# Patient Record
Sex: Male | Born: 1950
Health system: Southern US, Community
[De-identification: ages and names within clinical notes are randomized; demographics above are authoritative.]

## PROBLEM LIST (undated history)

## (undated) DIAGNOSIS — E039 Hypothyroidism, unspecified: Secondary | ICD-10-CM

## (undated) DIAGNOSIS — J939 Pneumothorax, unspecified: Secondary | ICD-10-CM

## (undated) DIAGNOSIS — E109 Type 1 diabetes mellitus without complications: Secondary | ICD-10-CM

## (undated) HISTORY — PX: CHEST TUBE INSERTION: SHX231

---

## 1977-04-06 DIAGNOSIS — E119 Type 2 diabetes mellitus without complications: Secondary | ICD-10-CM | POA: Insufficient documentation

## 2006-12-24 ENCOUNTER — Inpatient Hospital Stay (HOSPITAL_COMMUNITY): Admission: AD | Admit: 2006-12-24 | Discharge: 2006-12-25 | Payer: Self-pay | Admitting: Psychiatry

## 2006-12-24 ENCOUNTER — Ambulatory Visit: Payer: Self-pay | Admitting: Psychiatry

## 2009-02-08 ENCOUNTER — Ambulatory Visit: Payer: Self-pay | Admitting: Nurse Practitioner

## 2009-02-08 DIAGNOSIS — E039 Hypothyroidism, unspecified: Secondary | ICD-10-CM | POA: Insufficient documentation

## 2009-02-08 LAB — CONVERTED CEMR LAB
Bilirubin Urine: NEGATIVE
Blood Glucose, Fingerstick: 220
Blood in Urine, dipstick: NEGATIVE
Glucose, Urine, Semiquant: 250
Hgb A1c MFr Bld: 8.2 %
Nitrite: NEGATIVE
Protein, U semiquant: NEGATIVE
Rapid HIV Screen: NEGATIVE
Specific Gravity, Urine: 1.025
Urobilinogen, UA: 0.2
WBC Urine, dipstick: NEGATIVE
pH: 5.5

## 2009-02-11 ENCOUNTER — Encounter (INDEPENDENT_AMBULATORY_CARE_PROVIDER_SITE_OTHER): Payer: Self-pay | Admitting: Nurse Practitioner

## 2009-02-11 LAB — CONVERTED CEMR LAB
ALT: 21 units/L (ref 0–53)
AST: 20 units/L (ref 0–37)
Albumin: 4.4 g/dL (ref 3.5–5.2)
Alkaline Phosphatase: 95 units/L (ref 39–117)
BUN: 20 mg/dL (ref 6–23)
Basophils Absolute: 0.1 10*3/uL (ref 0.0–0.1)
Basophils Relative: 2 % — ABNORMAL HIGH (ref 0–1)
CO2: 29 meq/L (ref 19–32)
Calcium: 9.2 mg/dL (ref 8.4–10.5)
Chloride: 100 meq/L (ref 96–112)
Creatinine, Ser: 0.89 mg/dL (ref 0.40–1.50)
Eosinophils Absolute: 0.6 10*3/uL (ref 0.0–0.7)
Eosinophils Relative: 9 % — ABNORMAL HIGH (ref 0–5)
Free T4: 0.87 ng/dL (ref 0.80–1.80)
Glucose, Bld: 226 mg/dL — ABNORMAL HIGH (ref 70–99)
HCT: 45.7 % (ref 39.0–52.0)
Hemoglobin: 15.5 g/dL (ref 13.0–17.0)
Lymphocytes Relative: 41 % (ref 12–46)
Lymphs Abs: 2.5 10*3/uL (ref 0.7–4.0)
MCHC: 33.9 g/dL (ref 30.0–36.0)
MCV: 93.3 fL (ref 78.0–100.0)
Microalb, Ur: 0.59 mg/dL (ref 0.00–1.89)
Monocytes Absolute: 0.7 10*3/uL (ref 0.1–1.0)
Monocytes Relative: 11 % (ref 3–12)
Neutro Abs: 2.3 10*3/uL (ref 1.7–7.7)
Neutrophils Relative %: 37 % — ABNORMAL LOW (ref 43–77)
PSA: 0.84 ng/mL (ref 0.10–4.00)
Platelets: 229 10*3/uL (ref 150–400)
Potassium: 4.6 meq/L (ref 3.5–5.3)
RBC: 4.9 M/uL (ref 4.22–5.81)
RDW: 12.7 % (ref 11.5–15.5)
Sodium: 140 meq/L (ref 135–145)
T3, Free: 2.5 pg/mL (ref 2.3–4.2)
TSH: 9.544 microintl units/mL — ABNORMAL HIGH (ref 0.350–4.500)
Total Bilirubin: 1 mg/dL (ref 0.3–1.2)
Total Protein: 6.7 g/dL (ref 6.0–8.3)
WBC: 6.2 10*3/uL (ref 4.0–10.5)

## 2009-03-05 ENCOUNTER — Ambulatory Visit: Payer: Self-pay | Admitting: Nurse Practitioner

## 2009-03-05 LAB — CONVERTED CEMR LAB
Bilirubin Urine: NEGATIVE
Blood Glucose, Fingerstick: 38
Blood in Urine, dipstick: NEGATIVE
Glucose, Urine, Semiquant: NEGATIVE
Ketones, urine, test strip: NEGATIVE
Nitrite: NEGATIVE
Protein, U semiquant: NEGATIVE
Specific Gravity, Urine: <1.005
Urobilinogen, UA: 0.2
WBC Urine, dipstick: NEGATIVE
pH: 7

## 2009-03-06 ENCOUNTER — Encounter (INDEPENDENT_AMBULATORY_CARE_PROVIDER_SITE_OTHER): Payer: Self-pay | Admitting: Nurse Practitioner

## 2009-03-06 DIAGNOSIS — E7800 Pure hypercholesterolemia, unspecified: Secondary | ICD-10-CM | POA: Insufficient documentation

## 2009-03-06 LAB — CONVERTED CEMR LAB
Chlamydia, Swab/Urine, PCR: NEGATIVE
Cholesterol: 216 mg/dL — ABNORMAL HIGH (ref 0–200)
GC Probe Amp, Urine: NEGATIVE
HDL: 69 mg/dL (ref >39)
LDL Cholesterol: 120 mg/dL — ABNORMAL HIGH (ref 0–99)
Total CHOL/HDL Ratio: 3.1
Triglycerides: 134 mg/dL (ref <150)
VLDL: 27 mg/dL (ref 0–40)

## 2009-04-12 ENCOUNTER — Ambulatory Visit: Payer: Self-pay | Admitting: Nurse Practitioner

## 2009-04-15 LAB — CONVERTED CEMR LAB: TSH: 7.01 microintl units/mL — ABNORMAL HIGH (ref 0.350–4.500)

## 2009-04-22 ENCOUNTER — Telehealth (INDEPENDENT_AMBULATORY_CARE_PROVIDER_SITE_OTHER): Payer: Self-pay | Admitting: Nurse Practitioner

## 2009-04-25 ENCOUNTER — Ambulatory Visit: Payer: Self-pay | Admitting: Nurse Practitioner

## 2009-04-25 LAB — CONVERTED CEMR LAB
Blood Glucose, Fingerstick: 196
Cholesterol, target level: 200 mg/dL
HDL goal, serum: 40 mg/dL
LDL Goal: 100 mg/dL

## 2009-06-03 ENCOUNTER — Ambulatory Visit: Payer: Self-pay | Admitting: Nurse Practitioner

## 2009-06-03 LAB — CONVERTED CEMR LAB
Blood Glucose, Fingerstick: 111
Hgb A1c MFr Bld: 8.1 %

## 2009-06-17 ENCOUNTER — Ambulatory Visit: Payer: Self-pay | Admitting: Nurse Practitioner

## 2009-06-17 LAB — CONVERTED CEMR LAB
Cholesterol: 193 mg/dL (ref 0–200)
HDL: 66 mg/dL (ref >39)
LDL Cholesterol: 104 mg/dL — ABNORMAL HIGH (ref 0–99)
TSH: 3.61 microintl units/mL (ref 0.350–4.500)
Total CHOL/HDL Ratio: 2.9
Triglycerides: 117 mg/dL (ref <150)
VLDL: 23 mg/dL (ref 0–40)

## 2009-06-18 ENCOUNTER — Encounter (INDEPENDENT_AMBULATORY_CARE_PROVIDER_SITE_OTHER): Payer: Self-pay | Admitting: Nurse Practitioner

## 2009-07-23 ENCOUNTER — Encounter: Admission: RE | Admit: 2009-07-23 | Discharge: 2009-07-23 | Payer: Self-pay | Admitting: Nurse Practitioner

## 2009-09-12 ENCOUNTER — Ambulatory Visit: Payer: Self-pay | Admitting: Nurse Practitioner

## 2009-09-12 DIAGNOSIS — Z872 Personal history of diseases of the skin and subcutaneous tissue: Secondary | ICD-10-CM | POA: Insufficient documentation

## 2009-09-12 LAB — CONVERTED CEMR LAB
Blood Glucose, Fingerstick: 123
Hgb A1c MFr Bld: 8.1 %

## 2009-09-13 ENCOUNTER — Telehealth (INDEPENDENT_AMBULATORY_CARE_PROVIDER_SITE_OTHER): Payer: Self-pay | Admitting: Nurse Practitioner

## 2009-09-26 ENCOUNTER — Ambulatory Visit: Payer: Self-pay | Admitting: Nurse Practitioner

## 2009-09-26 LAB — CONVERTED CEMR LAB: Blood Glucose, Fingerstick: 394

## 2009-10-11 ENCOUNTER — Ambulatory Visit: Payer: Self-pay | Admitting: Nurse Practitioner

## 2009-10-11 DIAGNOSIS — I10 Essential (primary) hypertension: Secondary | ICD-10-CM | POA: Insufficient documentation

## 2009-10-11 LAB — CONVERTED CEMR LAB: Blood Glucose, Fingerstick: 177

## 2009-10-17 ENCOUNTER — Encounter (INDEPENDENT_AMBULATORY_CARE_PROVIDER_SITE_OTHER): Payer: Self-pay | Admitting: Nurse Practitioner

## 2009-10-22 ENCOUNTER — Encounter (INDEPENDENT_AMBULATORY_CARE_PROVIDER_SITE_OTHER): Payer: Self-pay | Admitting: Nurse Practitioner

## 2009-11-12 ENCOUNTER — Ambulatory Visit: Payer: Self-pay | Admitting: Nurse Practitioner

## 2009-11-12 LAB — CONVERTED CEMR LAB: Blood Glucose, Fingerstick: 36

## 2009-12-03 ENCOUNTER — Telehealth (INDEPENDENT_AMBULATORY_CARE_PROVIDER_SITE_OTHER): Payer: Self-pay | Admitting: Nurse Practitioner

## 2009-12-04 ENCOUNTER — Telehealth (INDEPENDENT_AMBULATORY_CARE_PROVIDER_SITE_OTHER): Payer: Self-pay | Admitting: Nurse Practitioner

## 2009-12-17 ENCOUNTER — Telehealth (INDEPENDENT_AMBULATORY_CARE_PROVIDER_SITE_OTHER): Payer: Self-pay | Admitting: Nurse Practitioner

## 2009-12-18 ENCOUNTER — Telehealth (INDEPENDENT_AMBULATORY_CARE_PROVIDER_SITE_OTHER): Payer: Self-pay | Admitting: Nurse Practitioner

## 2009-12-23 ENCOUNTER — Telehealth (INDEPENDENT_AMBULATORY_CARE_PROVIDER_SITE_OTHER): Payer: Self-pay | Admitting: Nurse Practitioner

## 2009-12-25 ENCOUNTER — Telehealth (INDEPENDENT_AMBULATORY_CARE_PROVIDER_SITE_OTHER): Payer: Self-pay | Admitting: Nurse Practitioner

## 2010-01-03 ENCOUNTER — Encounter (INDEPENDENT_AMBULATORY_CARE_PROVIDER_SITE_OTHER): Payer: Self-pay | Admitting: Nurse Practitioner

## 2010-01-13 ENCOUNTER — Ambulatory Visit: Payer: Self-pay | Admitting: Nurse Practitioner

## 2010-01-13 LAB — CONVERTED CEMR LAB: Blood Glucose, Fingerstick: 118

## 2010-02-14 ENCOUNTER — Telehealth (INDEPENDENT_AMBULATORY_CARE_PROVIDER_SITE_OTHER): Payer: Self-pay | Admitting: Nurse Practitioner

## 2010-02-14 ENCOUNTER — Ambulatory Visit: Payer: Self-pay | Admitting: Nurse Practitioner

## 2010-02-14 DIAGNOSIS — K029 Dental caries, unspecified: Secondary | ICD-10-CM | POA: Insufficient documentation

## 2010-02-14 LAB — CONVERTED CEMR LAB
Blood Glucose, Fingerstick: 76
Cholesterol: 182 mg/dL (ref 0–200)
Free T4: 0.96 ng/dL (ref 0.80–1.80)
HDL: 57 mg/dL (ref >39)
Hgb A1c MFr Bld: 6.7 %
LDL Cholesterol: 115 mg/dL — ABNORMAL HIGH (ref 0–99)
T3, Free: 3 pg/mL (ref 2.3–4.2)
TSH: 5.108 microintl units/mL — ABNORMAL HIGH (ref 0.350–4.500)
Total CHOL/HDL Ratio: 3.2
Triglycerides: 48 mg/dL (ref <150)
VLDL: 10 mg/dL (ref 0–40)

## 2010-02-17 ENCOUNTER — Encounter (INDEPENDENT_AMBULATORY_CARE_PROVIDER_SITE_OTHER): Payer: Self-pay | Admitting: Nurse Practitioner

## 2010-02-17 ENCOUNTER — Telehealth (INDEPENDENT_AMBULATORY_CARE_PROVIDER_SITE_OTHER): Payer: Self-pay | Admitting: Nurse Practitioner

## 2010-02-24 ENCOUNTER — Encounter (INDEPENDENT_AMBULATORY_CARE_PROVIDER_SITE_OTHER): Payer: Self-pay | Admitting: *Deleted

## 2010-03-03 ENCOUNTER — Ambulatory Visit: Payer: Self-pay | Admitting: Nurse Practitioner

## 2010-05-08 ENCOUNTER — Ambulatory Visit: Admit: 2010-05-08 | Payer: Self-pay | Admitting: Nurse Practitioner

## 2010-05-08 ENCOUNTER — Encounter: Payer: Self-pay | Admitting: Nurse Practitioner

## 2010-05-08 ENCOUNTER — Encounter (INDEPENDENT_AMBULATORY_CARE_PROVIDER_SITE_OTHER): Payer: Self-pay | Admitting: Nurse Practitioner

## 2010-05-08 LAB — CONVERTED CEMR LAB
ALT: 20 units/L (ref 0–53)
AST: 23 units/L (ref 0–37)
Albumin: 4.3 g/dL (ref 3.5–5.2)
Alkaline Phosphatase: 81 units/L (ref 39–117)
BUN: 18 mg/dL (ref 6–23)
Basophils Absolute: 0.1 10*3/uL (ref 0.0–0.1)
Basophils Relative: 1 % (ref 0–1)
Bilirubin Urine: NEGATIVE
Blood Glucose, Fingerstick: 110
Blood in Urine, dipstick: NEGATIVE
CO2: 27 meq/L (ref 19–32)
Calcium: 9.1 mg/dL (ref 8.4–10.5)
Chloride: 104 meq/L (ref 96–112)
Creatinine, Ser: 0.88 mg/dL (ref 0.40–1.50)
Creatinine, Urine: 132.2 mg/dL
Eosinophils Absolute: 0.2 10*3/uL (ref 0.0–0.7)
Eosinophils Relative: 3 % (ref 0–5)
Glucose, Bld: 155 mg/dL — ABNORMAL HIGH (ref 70–99)
Glucose, Urine, Semiquant: NEGATIVE
HCT: 42.8 % (ref 39.0–52.0)
Hemoglobin: 14.4 g/dL (ref 13.0–17.0)
Hgb A1c MFr Bld: 7.2 %
Ketones, urine, test strip: NEGATIVE
Lymphocytes Relative: 32 % (ref 12–46)
Lymphs Abs: 2 10*3/uL (ref 0.7–4.0)
MCHC: 33.6 g/dL (ref 30.0–36.0)
MCV: 92.6 fL (ref 78.0–100.0)
Microalb Creat Ratio: 3.9 mg/g (ref 0.0–30.0)
Microalb, Ur: 0.52 mg/dL (ref 0.00–1.89)
Monocytes Absolute: 0.5 10*3/uL (ref 0.1–1.0)
Monocytes Relative: 7 % (ref 3–12)
Neutro Abs: 3.6 10*3/uL (ref 1.7–7.7)
Neutrophils Relative %: 58 % (ref 43–77)
Nitrite: NEGATIVE
PSA: 0.68 ng/mL (ref <=4.00)
Platelets: 224 10*3/uL (ref 150–400)
Potassium: 3.9 meq/L (ref 3.5–5.3)
Protein, U semiquant: NEGATIVE
RBC: 4.62 M/uL (ref 4.22–5.81)
RDW: 12.6 % (ref 11.5–15.5)
Sodium: 143 meq/L (ref 135–145)
Specific Gravity, Urine: 1.015
TSH: 2.201 microintl units/mL (ref 0.350–4.500)
Total Bilirubin: 1.3 mg/dL — ABNORMAL HIGH (ref 0.3–1.2)
Total Protein: 6.7 g/dL (ref 6.0–8.3)
Urobilinogen, UA: 0.2
WBC Urine, dipstick: NEGATIVE
WBC: 6.3 10*3/uL (ref 4.0–10.5)
pH: 5.5

## 2010-05-08 NOTE — Progress Notes (Signed)
Summary: [facility] Ref[facility]  Phone Note Outgoing Call   Summary of Call: refer to [facility]  worrisome skin lesion on left flank Initial call taken by: Lehman Prom FNP,  September 13, 2009 10:00 AM  Follow-up for Phone Call        Is Pt aware that this will be out of pocket? Follow-up by: Candi Leash,  September 16, 2009 8:40 AM  Additional Follow-up for Phone Call Additional follow up Details #1::        No, this office previously saw our pts at cost of co-payment.  Has this changed recently? If so, refer to dermatology at [address]. ? 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 Will call [facility] to follow up  thanks.  will await the update n.[email or web address],fnp  September 16, 2009  9:40 AM   Follow-up by: Candi Leash,  September 16, 2009 9:31 AM  Additional Follow-up for Phone Call Additional follow up Details #3:: Details for Additional Follow-up Action Taken: Well spoke to [facility] &  Pt is responsible for the bill.Pt would need to come in with 85.00 then work out an payment plan.I will call the Pt to see if this is affordable for him.   OK.  n.[email or web address],fnp  September 18, 2009  4:18 PM   Additional Follow-up by: Candi Leash,  September 18, 2009 3:47 PM

## 2010-05-08 NOTE — Letter (Signed)
Summary: MAP ASSISTANCE PROGRAM//FAXED  MAP ASSISTANCE PROGRAM//FAXED   Imported By: Arta Bruce 01/03/2010 10:42:47  _____________________________________________________________________  External Attachment:    Type:   Image     Comment:   External Document

## 2010-05-08 NOTE — Progress Notes (Signed)
Summary: Lab results  Phone Note Outgoing Call   Summary of Call: notify pt's sister (he signed for her to get this information during last visit)  Tanya Nones [phone] Cholesterol labs ok during recent office visit. Thyroid lab is just slightly higher than it should be but will monitor for now.  An increase in medication at this time may convert pt to HYPERthyroid (too much) status. thyroid lab will be rechecked in 3 months. pt will be mailed a letter  Initial call taken by: Lehman Prom FNP,  February 17, 2010 8:56 AM  Follow-up for Phone Call        left message on machine for pt's sister to return call to the office. Levon Hedger  February 18, 2010 2:36 PM    pt's sister informed of above information.  Follow-up by: Levon Hedger,  February 18, 2010 3:06 PM

## 2010-05-08 NOTE — Letter (Signed)
Summary: DERMATOLOGY & SKIN  CARE REPORT  DERMATOLOGY & SKIN  CARE REPORT   Imported By: Arta Bruce 10/23/2009 12:43:15  _____________________________________________________________________  External Attachment:    Type:   Image     Comment:   External Document

## 2010-05-08 NOTE — Letter (Signed)
Summary: Handout Printed  Printed Handout:  - Diet - Low-Fat, Low-Saturated-Fat, Low-Cholesterol Diets 

## 2010-05-08 NOTE — Progress Notes (Signed)
Summary: med refill  Phone Note Refill Request Message from:  Patient  Refills Requested: Medication #1:  LANTUS 100 UNIT/ML SOLN 12 units subcutaneously nightly. [facility] [address]  Initial call taken by: Oscar La,  December 17, 2009 12:30 PM  Follow-up for Phone Call        forward to N. Daphine Deutscher, fnp Follow-up by: Levon Hedger,  December 17, 2009 2:59 PM  Additional Follow-up for Phone Call Additional follow up Details #1::        will send lantus to [facility] per pt request but he should be advise it will be expensive.  why is he not using [facility]?  they could order lantus from a medication assistance plan so it would not cost him so much money Additional Follow-up by: Lehman Prom FNP,  December 17, 2009 6:38 PM    Additional Follow-up for Phone Call Additional follow up Details #2::    Levon Hedger  December 18, 2009 9:20 AM Left message on machine for pt to return call to the office.  Additional Follow-up for Phone Call Additional follow up Details #3:: Details for Additional Follow-up Action Taken: pt was wanting to get meds at one pharmacy. He is currently getting NPH insulin at [facility] so he thought getting about Lantus as well. pt advised Lantus is expensive and is willing to get meds at [facility]. Please transfer all meds/ RX's to HD... Chantel Miller  December 18, 2009 11:58 AM   Rx printed and faxed to [facility] n.[email or web address],fnp December 18, 2009  12:36 PM     Prescriptions: NOVOLIN 70/30 70-30 % SUSP (INSULIN ISOPHANE & REGULAR) 28 units subcutaneously in the morning and 28 units subcutaneously at night for diabetes  #1 month qs x 3   Entered and Authorized by:   Lehman Prom FNP   Signed by:   Lehman Prom FNP on 12/18/2009   Method used:   Printed then faxed to ...       [facility] (retail)       [address] [place], [place]  [address]       Ph: [phone]      Fax: [phone]   RxID:   [id number] LANTUS 100 UNIT/ML SOLN (INSULIN GLARGINE) 12 units subcutaneously nightly  #1 month qs x 3   Entered and Authorized by:   Lehman Prom FNP   Signed by:   Lehman Prom FNP on 12/18/2009   Method used:   Printed then faxed to ...       [facility] (retail)       [address] [place], [place]  [address]       Ph: [phone]       Fax: [phone]   RxID:   [id number] LANTUS 100 UNIT/ML SOLN (INSULIN GLARGINE) 12 units subcutaneously nightly  #1 month qs x 3   Entered and Authorized by:   Lehman Prom FNP   Signed by:   Lehman Prom FNP on 12/17/2009   Method used:   Electronically to        [facility] [phone]* (retail)       [address]       [place], [place]  [address]       Ph: [phone]       Fax: [phone]   RxID:   [id number]

## 2010-05-08 NOTE — Progress Notes (Signed)
Summary: BLOOD SUGARS KEEP DROPPING REAL LOW  Phone Note Call from Patient Call back at Home Phone [phone] Call back at [phone]   Caller: [name]- SISTER Summary of Call: MARTI PT. SISTER CALLED AND SAYS THAT [name] SUGAR KEEPS DROPPING AND HE IS NOT AWARE OF IT, AND THEY ARE TRYING TO GET HIM A MONITOR THAT WILL ALARM HIM WHEN HIS SUGAR IS GETTING LOW. THE NAME OF THE DEVICE IS CALLED DEXCOM 7 PLUS CONTINIOUS  GLUCOSE MONITORING SYSTEM, AND THIS HAS TO BE ON THE RX, AND IF YOU HAVE ANY QUESTIONS, WE CAN CALL THE [place] SALES REP. JIM STROUP AT [phone].Marland Kitchen  SISTER IS CONCERNED ABOUT HIM, AND THINKS MAYBE HISTHYROIDS MIGHT NEED TO BE CHECKED, AND HE HAS FALLEN ASLEEP WHILE DRIVING Initial call taken by: Leodis Rains,  December 03, 2009 9:42 AM  Follow-up for Phone Call        Sent to N. Daphine Deutscher.  Dutch Quint RN  December 03, 2009 10:37 AM   Additional Follow-up for Phone Call Additional follow up Details #1::        Notify pt's sister the following - PT'S SUGAR KEEPS DROPPING BECAUSE HE CONTINUES TO TAKE INSULIN BASED ON WHAT HE THINKS HE SHOULD TAKE NOT WHAT THE [name] PRESCRIBES. AS LONG AS HE KEEPS DOING THIS THEN HE WILL HAVE HYPOGLYCEMIC EPISODES.  He also is not following a diabetic diet, he eats whatever he wants Thyroid is doing fine at last check. Additional Follow-up by: Lehman Prom FNP,  December 03, 2009 11:04 AM    Additional Follow-up for Phone Call Additional follow up Details #2::    Left message on answering machine for pt. to return call.  Dutch Quint RN  December 03, 2009 12:01 PM  Explained to sister what [name]'s response and recommendations.  Still thinks that he's not mentally able to monitor himself without the continuous monitor- would like a Rx for it, that her parents will pay for it if needed.  She thinks that the visual/audio aspect of the continuous monitor will help keep him on track.     States that his activity level has gone way up  and he isn't eating enough -- that he's taking too much insulin for his dietary needs.  Thinks that his activity level has changed his insulin needs and that talking to someone would help him understand.  Would like appt. with Susie Piper so that he can have his dietary and insulin regimen clarified.    Follow-up by: Dutch Quint RN,  December 03, 2009 4:10 PM  Additional Follow-up for Phone Call Additional follow up Details #3:: Details for Additional Follow-up Action Taken: order for meter written and in basket pt/sister can pick up  Pt. sister notified of Rx -- will pick up.  Dutch Quint RN  December 04, 2009 10:32 AM  Additional Follow-up by: Lehman Prom FNP,  December 04, 2009 8:26 AM

## 2010-05-08 NOTE — Letter (Signed)
Summary: Lipid Letter  [facility]  [address] [place], [place] [address]   Phone: [phone]  Fax: [phone]    06/18/2009  [name] [address] [place], [place]  [address]  Dear [name]:  We have carefully reviewed your last lipid profile from 06/17/2009 and the results are noted below with a summary of recommendations for lipid management.    Cholesterol:       193     Goal: less than 200   HDL "good" Cholesterol:   66     Goal: greater than 40   LDL "bad" Cholesterol:   104     Goal: less than 70   Triglycerides:       117     Goal: less than 150    Your cholesterol is doing much better.  Keep taking your cholesterol medications Your thyroid lab is ok. Continue taking your thyroid medications.     Current Medications: 1)    Novolin 70/30 70-30 % Susp (Insulin isophane & regular) .... 30 units subcutaneously in the morning and 30 units subcutaneously at night for diabetes 2)    Blood Glucose Meter  Kit (Blood glucose monitoring suppl) .... Dispense to check blood sugar twice daily 3)    Lancets  Misc (Lancets) .... Use to check blood sugar twice daily 4)    Blood Glucose Test  Strp (Glucose blood) .... Use to check blood sugar twice daily 5)    Levothyroxine Sodium 50 Mcg Tabs (Levothyroxine sodium) .... One tablet by mouth daily for thyroid **pharmacy - note change in dose** 6)    Pravastatin Sodium 10 Mg Tabs (Pravastatin sodium) .... One tablet by mouth nightly for cholesterol 7)    Bayer Childrens Aspirin 81 Mg Chew (Aspirin) .... One tablet by mouth daily  If you have any questions, please call. We appreciate being able to work with you.   Sincerely,    [facility] Lehman Prom FNP

## 2010-05-08 NOTE — Progress Notes (Signed)
Summary: Dental Referral  Phone Note Outgoing Call   Summary of Call: got a request that pt wants dental referral for hot and cold sensitiivty in teeth dental clinic will only see pt for repairs and extractions of teeth for sensitivity issues he can use sensidine toothpaste which can be purchased at any local pharmacy Initial call taken by: Lehman Prom FNP,  February 14, 2010 5:39 PM  Follow-up for Phone Call        C[name]  February 18, 2010 3:10 PM Left message on machine for pt to return call to the office.  Cory [name]  February 21, 2010 3:05 PM Left message on machine for pt to return call to the office.  Cory [name]  February 24, 2010 2:36 PM Left message on machine for pt to return call to the office.  Will mail letter. Follow-up by: [name],  February 24, 2010 2:36 PM

## 2010-05-08 NOTE — Assessment & Plan Note (Signed)
Summary: NEW - Establish Care   Vital Signs:  Patient profile:   60 year old male Height:      71.75 inches Weight:      179.3 pounds BMI:     24.58 BSA:     2.03 Temp:     97.8 degrees F oral Pulse rate:   66 / minute Pulse rhythm:   regular Resp:     16 per minute BP sitting:   119 / 73  (left arm) Cuff size:   large  Vitals Entered By: Levon Hedger (February 08, 2009 2:51 PM) CC: new establish DM Is Patient Diabetic? Yes Pain Assessment Patient in pain? no      CBG Result 220 CBG Device ID A  Does patient need assistance? Functional Status Self care Ambulation Normal Comments pt is using Humulin 70/30    CC:  new establish DM.  History of Present Illness:  Pt into the office to establish care. No previous PCP  HX - Diabetes since 1979 - insulin dependent He reports that he has been taking insulin daily.  He purchases it over the counter.   Pt does have a glucometer and sporatically checks his blood sugar.  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 Sister present with pt today in office.   Habits & Providers  Alcohol-Tobacco-Diet     Alcohol drinks/day: 0     Tobacco Status: never  Exercise-Depression-Behavior     Does Patient Exercise: no     Have you felt down or hopeless? no     Have you felt little pleasure in things? yes     Depression Counseling: going to a couselor     Drug Use: never  Comments: Admits to some recent stressors at home for which he is sorting out legally and is here today to fulfil his requirement to have a "check up"  Allergies (verified): No Known Drug Allergies  Past History:  Past Surgical History: pneumothorax - 1979  Family History: prostate cancer - father ulcerative colitis - daughter  Social History: married 5 children  tobacco - none ETOH -  Drug - Smoking Status:  never Drug Use:  never Does Patient Exercise:   no  Review of Systems General:  cold sensitivity. CV:  Denies chest pain or discomfort. Resp:  Denies cough. GI:  Denies abdominal pain. Neuro:  Denies numbness and tingling. Psych:  Complains of mental problems; short term memory problems. Endo:  Denies excessive thirst and excessive urination.  Physical Exam  General:  alert.   Head:  male-pattern balding.   Lungs:  normal breath sounds.   Heart:  normal rate and regular rhythm.   Abdomen:  normal bowel sounds.   Msk:  up to the exam table Neurologic:  alert & oriented X3.     Impression & Recommendations:  Problem # 1:  DIABETES MELLITUS, TYPE II, ON INSULIN (ICD-250.00) uncontrolled. will change insulin advised pt on his Hgba1c value and goal  His updated medication list for this problem includes:    Novolin 70/30 70-30 % Susp (Insulin isophane & regular) .Marland Kitchen... 32 units subcutaneously in the morning and 32 units subcutaneously at night for diabetes  Orders: Capillary Blood Glucose/CBG ([address]) Hgb A1C (60454UJ) T-Comprehensive Metabolic Panel (81191-47829) T-CBC w/Diff (56213-08657) T-PSA (84696-29528) T-TSH (41324-40102) T-Urine Microalbumin w/creat. ratio [id number]) T-HIV Antibody  (Reflex) ([id number])  Problem # 2:  HYPOTHYROIDISM (ICD-244.9) will check labs given pt's history  Complete Medication List: 1)  Novolin 70/30 70-30 % Susp (Insulin isophane & regular) .... 32 units subcutaneously in the morning and 32 units subcutaneously at night for diabetes 2)  Blood Glucose Meter Kit (Blood glucose monitoring suppl) .... Dispense to check blood sugar twice daily 3)  Lancets Misc (Lancets) .... Use to check blood sugar twice daily 4)  Blood Glucose Test Strp (Glucose blood) .... Use to check blood sugar twice daily  Patient Instructions: 1)  Schedule an appointment for a complete physical exam 2)  You will need to be fasting before this visit for lipids. 3)  Will need EKG, rectal/prostate, PHQ-9, Flu  Vaccine, foot check 4)  Your blood sugar is elevated.  You will need to increase your insulin, monitor your diet and exercise for better control. Prescriptions: NOVOLIN 70/30 70-30 % SUSP (INSULIN ISOPHANE & REGULAR) 32 units subcutaneously in the morning and 32 units subcutaneously at night for diabetes  #1 month qs x 3   Entered and Authorized by:   Lehman Prom FNP   Signed by:   Lehman Prom FNP on 02/11/2009   Method used:   Telephoned to ...         RxID:   [id number] BLOOD GLUCOSE TEST  STRP (GLUCOSE BLOOD) Use to check blood sugar twice daily  #100 x 3   Entered and Authorized by:   Lehman Prom FNP   Signed by:   Lehman Prom FNP on 02/08/2009   Method used:   Print then Give to Patient   RxID:   [id number] LANCETS  MISC (LANCETS) Use to check blood sugar twice daily  #100 x 3   Entered and Authorized by:   Lehman Prom FNP   Signed by:   Lehman Prom FNP on 02/08/2009   Method used:   Print then Give to Patient   RxID:   [id number] BLOOD GLUCOSE METER  KIT (BLOOD GLUCOSE MONITORING SUPPL) Dispense to check blood sugar twice daily  #1 meter x 0   Entered and Authorized by:   Lehman Prom FNP   Signed by:   Lehman Prom FNP on 02/08/2009   Method used:   Print then Give to Patient   RxID:   [id number] NOVOLOG MIX 70/30 70-30 % SUSP (INSULIN ASPART PROT & ASPART) 32 units in the morning and 32 units at night subcutaneously for diabetes  #1 month qs x 3   Entered and Authorized by:   Lehman Prom FNP   Signed by:   Lehman Prom FNP on 02/08/2009   Method used:   Print then Give to Patient   RxID:   [id number]   Laboratory Results   Urine Tests  Date/Time Received: February 08, 2009 4:07 PM  Date/Time Reported: February 08, 2009 4:07 PM n  Routine Urinalysis   Color: lt. yellow Appearance: Clear Glucose: 250   (Normal Range: Negative) Bilirubin: negative   (Normal Range: Negative) Ketone: trace (5)   (Normal  Range: Negative) Spec. Gravity: 1.025   (Normal Range: 1.003-1.035) Blood: negative   (Normal Range: Negative) pH: 5.5   (Normal Range: 5.0-8.0) Protein: negative   (Normal Range: Negative) Urobilinogen: 0.2   (Normal Range: 0-1) Nitrite: negative   (Normal Range: Negative) Leukocyte Esterace: negative   (Normal Range: Negative)     Blood Tests   Date/Time Received: February 08, 2009 3:35 PM   HGBA1C: 8.2%   (Normal Range: Non-Diabetic - 3-6%   Control Diabetic - 6-8%) CBG Random:: 220mg /dL  Date/Time Received:  February 08, 2009 5:23 PM  Date/Time Reported: February 08, 2009 5:23 PM   Other Tests  Rapid HIV: negative   Laboratory Results   Urine Tests    Routine Urinalysis   Color: lt. yellow Appearance: Clear Glucose: 250   (Normal Range: Negative) Bilirubin: negative   (Normal Range: Negative) Ketone: trace (5)   (Normal Range: Negative) Spec. Gravity: 1.025   (Normal Range: 1.003-1.035) Blood: negative   (Normal Range: Negative) pH: 5.5   (Normal Range: 5.0-8.0) Protein: negative   (Normal Range: Negative) Urobilinogen: 0.2   (Normal Range: 0-1) Nitrite: negative   (Normal Range: Negative) Leukocyte Esterace: negative   (Normal Range: Negative)     Blood Tests     HGBA1C: 8.2%   (Normal Range: Non-Diabetic - 3-6%   Control Diabetic - 6-8%) CBG Random:: 220    Other Tests  Rapid HIV: negative

## 2010-05-08 NOTE — Progress Notes (Signed)
Summary: MAP called to clarify insulins  Phone Note Outgoing Call   Summary of Call: Crystal with the MAP program wants to clarify that Humulin 70/30 28 units every morning and 28 units every night and Lantus 12 units at night are still what you want to prescribe or would you prefer a Novolog or Humulog with meals plus Lantus at night?  Please call her back and let her know.  [phone] (back door number). Initial call taken by: Dutch Quint RN,  December 25, 2009 11:13 AM  Follow-up for Phone Call        Pt should take Novolog 70/30 28 units subcutaneously with breakfast and dinner. He should take lantus 12 units nightly before bedtime Follow-up by: Lehman Prom FNP,  December 25, 2009 11:34 AM  Additional Follow-up for Phone Call Additional follow up Details #1::        Needs further clarification.  Sent to N. Daphine Deutscher.  Dutch Quint RN  December 25, 2009 12:13 PM     Additional Follow-up for Phone Call Additional follow up Details #2::    spoke with the pharmacy and has clarified the meds Follow-up by: Lehman Prom FNP,  December 25, 2009 2:08 PM  New/Updated Medications: NOVOLOG MIX 70/30 70-30 % SUSP (INSULIN ASPART PROT & ASPART) 28 units subcutaneously before breakfast and dinner

## 2010-05-08 NOTE — Miscellaneous (Signed)
Summary: AUTHORIZATION TO RELEASE MEDICAL INFO.  AUTHORIZATION TO RELEASE MEDICAL INFO.   Imported By: Arta Bruce 02/17/2010 14:51:09  _____________________________________________________________________  External Attachment:    Type:   Image     Comment:   External Document

## 2010-05-08 NOTE — Assessment & Plan Note (Signed)
Summary: Diabetes   Vital Signs:  Patient profile:   60 year old male Weight:      191.1 pounds BMI:     26.19 BSA:     2.09 Temp:     98.1 degrees F oral Pulse rate:   67 / minute Pulse rhythm:   regular Resp:     16 per minute BP sitting:   122 / 77  (left arm) Cuff size:   regular  Vitals Entered By: Levon Hedger (June 03, 2009 2:09 PM) CC: follow-up visit 3 months DM, Lipid Management Is Patient Diabetic? Yes Pain Assessment Patient in pain? no      CBG Result 111 CBG Device ID A  Does patient need assistance? Functional Status Self care Ambulation Normal   CC:  follow-up visit 3 months DM and Lipid Management.  History of Present Illness:  Pt into the office for diabetes follow up. He was seen in this office last month and during which time he was having hypoglycemic episodes. Pt has adjusted his insulin down to 30 units two times a day.  Pt also reports that he is using the same syringe with both injections as a means to remember whether or not he has taking his insuin instructed pt to draw up insuin in  separate syringes and place in the [place].  If he does this daily then it will help him keep remember if he has taken his insulin Pt is still exercising at least 30 minutes three times per week.  Diabetes Management History:      The patient is a 60 years old male who comes in for evaluation of DM Type 2.  He has not been enrolled in the "Diabetic Education Program".  He states lack of understanding of dietary principles and is not following his diet appropriately.  No sensory loss is reported.  Self foot exams are being performed.  He is checking home blood sugars.  He says that he is exercising.  Type of exercise includes: running.  Duration of exercise is estimated to be 30 min.  He is doing this 3 times per week.        Hypoglycemic symptoms are not occurring.  No hyperglycemic symptoms are reported.  Other comments include: no hypoglycemia since his  last visit here however he has decreased his insulin down to 30units two times a day .        There are no symptoms to suggest diabetic complications.  The following changes have been made to his treatment plan since last visit: insulin dosing.  Treatment plan changes were initiated by patient.    Lipid Management History:      Positive NCEP/ATP III risk factors include male age 40 years old or older and diabetes.  Negative NCEP/ATP III risk factors include HDL cholesterol greater than 60, non-tobacco-user status, non-hypertensive, no ASHD (atherosclerotic heart disease), no prior stroke/TIA, no peripheral vascular disease, and no history of aortic aneurysm.        The patient states that he does not know about the "Therapeutic Lifestyle Change" diet.  The patient does not know about adjunctive measures for cholesterol lowering.  He expresses no side effects from his lipid-lowering medication.  Comments include: Pt is not taking his cholesterol medications as ordered.  The patient denies any symptoms to suggest myopathy or liver disease.      Habits & Providers  Alcohol-Tobacco-Diet     Alcohol drinks/day: 0     Tobacco Status: never  Exercise-Depression-Behavior  Does Patient Exercise: yes     Exercise Counseling: not indicated; exercise is adequate     Type of exercise: running     Exercise (avg: min/session): 30 min.     Times/week: 3     Have you felt down or hopeless? no     Have you felt little pleasure in things? no     Depression Counseling: going to a couselor     Drug Use: never  Medications Prior to Update: 1)  Novolin 70/30 70-30 % Susp (Insulin Isophane & Regular) .... 32 Units Subcutaneously in The Morning and 32 Units Subcutaneously At Night For Diabetes 2)  Blood Glucose Meter  Kit (Blood Glucose Monitoring Suppl) .... Dispense To Check Blood Sugar Twice Daily 3)  Lancets  Misc (Lancets) .... Use To Check Blood Sugar Twice Daily 4)  Blood Glucose Test  Strp (Glucose  Blood) .... Use To Check Blood Sugar Twice Daily 5)  Levothyroxine Sodium 50 Mcg Tabs (Levothyroxine Sodium) .... One Tablet By Mouth Daily For Thyroid **pharmacy - Note Change in Dose** 6)  Pravastatin Sodium 10 Mg Tabs (Pravastatin Sodium) .... One Tablet By Mouth Nightly For Cholesterol  Allergies (verified): No Known Drug Allergies  Review of Systems General:  Denies fever. CV:  Denies chest pain or discomfort. Resp:  Denies cough. GI:  Denies abdominal pain, nausea, and vomiting. GU:  Denies discharge.  Physical Exam  General:  alert.   Head:  normocephalic.  thinning hair  Lungs:  normal breath sounds.   Heart:  normal rate and regular rhythm.   Abdomen:  normal bowel sounds.   Msk:  normal ROM.   Neurologic:  alert & oriented X3.   Skin:  color normal.   Psych:  Oriented X3.    Diabetes Management Exam:    Foot Exam (with socks and/or shoes not present):       Sensory-Monofilament:          Left foot: normal          Right foot: normal       Inspection:          Left foot: normal          Right foot: normal       Nails:          Left foot: normal          Right foot: normal   Impression & Recommendations:  Problem # 1:  DIABETES MELLITUS, TYPE II, ON INSULIN (ICD-250.00) Pt would like to get diabetes education and some group sessions and speak to others who have diabetes stil uncontrolled  advised pt to continue exercise and take insulin as ordered exercise is also helpful His updated medication list for this problem includes:    Novolin 70/30 70-30 % Susp (Insulin isophane & regular) .Marland KitchenMarland KitchenMarland KitchenMarland Kitchen 30 units subcutaneously in the morning and 30 units subcutaneously at night for diabetes    Bayer Childrens Aspirin 81 Mg Chew (Aspirin) ..... One tablet by mouth daily  Orders: Capillary Blood Glucose/CBG (56213) Diabetic Clinic Referral (Diabetic) Hgb A1C (08657QI)  Problem # 2:  HYPOTHYROIDISM (ICD-244.9) will check labs on next visit His updated medication list  for this problem includes:    Levothyroxine Sodium 50 Mcg Tabs (Levothyroxine sodium) ..... One tablet by mouth daily for thyroid **pharmacy - note change in dose**  Problem # 3:  HYPERCHOLESTEROLEMIA (ICD-272.0) pt is not taking meds will re-check cholesterol on next visit  His updated medication list for this problem includes:  Pravastatin Sodium 10 Mg Tabs (Pravastatin sodium) ..... One tablet by mouth nightly for cholesterol  Complete Medication List: 1)  Novolin 70/30 70-30 % Susp (Insulin isophane & regular) .... 30 units subcutaneously in the morning and 30 units subcutaneously at night for diabetes 2)  Blood Glucose Meter Kit (Blood glucose monitoring suppl) .... Dispense to check blood sugar twice daily 3)  Lancets Misc (Lancets) .... Use to check blood sugar twice daily 4)  Blood Glucose Test Strp (Glucose blood) .... Use to check blood sugar twice daily 5)  Levothyroxine Sodium 50 Mcg Tabs (Levothyroxine sodium) .... One tablet by mouth daily for thyroid **pharmacy - note change in dose** 6)  Pravastatin Sodium 10 Mg Tabs (Pravastatin sodium) .... One tablet by mouth nightly for cholesterol 7)  Bayer Childrens Aspirin 81 Mg Chew (Aspirin) .... One tablet by mouth daily  Diabetes Management Assessment/Plan:      The following lipid goals have been established for the patient: Total cholesterol goal of 200; LDL cholesterol goal of 100; HDL cholesterol goal of 40; Triglyceride goal of 150.  His blood pressure goal is < 130/80.    Lipid Assessment/Plan:      Based on NCEP/ATP III, the patient's risk factor category is "history of diabetes".  The patient's lipid goals are as follows: Total cholesterol goal is 200; LDL cholesterol goal is 100; HDL cholesterol goal is 40; Triglyceride goal is 150.    Patient Instructions: 1)  Schedule an appointment for fasting lab visit - lipids, Tsh - nurse visit (morning appointment) 2)  Do not eat after midnight before this visit 3)  You will be  referred to nutritionalist at [facility] for your diabetes 4)  You should take a baby aspirin daily for heart protection 5)  Follow up with n.martin,fnp in 3 months for diabetes. 6)  will need cbg, hgba1c.  Laboratory Results   Blood Tests   Date/Time Received: June 03, 2009 2:45 PM   HGBA1C: 8.1%   (Normal Range: Non-Diabetic - 3-6%   Control Diabetic - 6-8%) CBG Random:: 111       Last LDL:                                                 120 (03/05/2009 9:21:00 PM)        Diabetic Foot Exam  Set Next Diabetic Foot Exam here: 08/31/2009   10-g (5.07) Semmes-Weinstein Monofilament Test Performed by: Levon Hedger          Right Foot          Left Foot Visual Inspection               Test Control      normal         normal Site 1         normal         normal Site 2         normal         normal Site 3         normal         normal Site 4         normal         normal Site 5         normal         normal Site  6         normal         normal Site 7         normal         normal Site 8         normal         normal Site 9         normal         normal Site 10         normal         normal  Impression      normal         normal   Laboratory Results   Blood Tests     HGBA1C: 8.1%   (Normal Range: Non-Diabetic - 3-6%   Control Diabetic - 6-8%) CBG Random:: 111mg /dL     Prevention & Chronic Care Immunizations   Influenza vaccine: Fluvax 3+  (04/25/2009)   Influenza vaccine deferral: Refused  (03/05/2009)    Tetanus booster: 03/05/2009: Tdap    Pneumococcal vaccine: Not documented   Pneumococcal vaccine deferral: Refused  (06/03/2009)  Colorectal Screening   Hemoccult: Not documented    Colonoscopy: Not documented   Colonoscopy action/deferral: Refused  (03/05/2009)  Other Screening   PSA: 0.84  (02/08/2009)   PSA action/deferral: Discussed-PSA requested  (03/05/2009)   PSA due due: 02/08/2010   Smoking status: never   (06/03/2009)  Diabetes Mellitus   HgbA1C: 8.1  (06/03/2009)   HgbA1C action/deferral: Ordered  (03/05/2009)   Hemoglobin A1C due: 05/11/2009    Eye exam: Not documented    Foot exam: yes  (06/03/2009)   Foot exam action/deferral: Do today   High risk foot: Not documented   Foot care education: Not documented   Foot exam due: 08/31/2009    Urine microalbumin/creatinine ratio: Not documented   Urine microalbumin action/deferral: Deferred    Diabetes flowsheet reviewed?: Yes   Progress toward A1C goal: Unchanged  Lipids   Total Cholesterol: 216  (03/05/2009)   Lipid panel action/deferral: Lipid Panel ordered   LDL: 120  (03/05/2009)   LDL Direct: Not documented   HDL: 69  (03/05/2009)   Triglycerides: 134  (03/05/2009)    SGOT (AST): 20  (02/08/2009)   SGPT (ALT): 21  (02/08/2009)   Alkaline phosphatase: 95  (02/08/2009)   Total bilirubin: 1.0  (02/08/2009)  Self-Management Support :    Diabetes self-management support: Not documented   Referred for diabetes self-mgmt training.    Lipid self-management support: Not documented

## 2010-05-08 NOTE — Assessment & Plan Note (Signed)
Summary: Diabetes   Vital Signs:  Patient profile:   60 year old male Weight:      197 pounds Temp:     97.3 degrees F Pulse rate:   65 / minute Pulse rhythm:   regular Resp:     16 per minute BP sitting:   163 / 80  (left arm) Cuff size:   large  Vitals Entered By: Vesta Mixer CMA (September 12, 2009 2:43 PM)  Serial Vital Signs/Assessments:  Time      Position  BP       Pulse  Resp  Temp     By                     130/84                         Lehman Prom FNP  CC: diabetes f/u.  , Lipid Management Is Patient Diabetic? Yes Pain Assessment Patient in pain? no      CBG Result 123  Does patient need assistance? Ambulation Normal   CC:  diabetes f/u.   and Lipid Management.  History of Present Illness:  pt into the office for diabetes follow up.  Left flank - area in question has been present for the several years he has just noticed in the past year that it is increasing in size Pt keeps covered with a bandaid becuase sometimes even slight irriation makes it bleed  Diabetes Management History:      The patient is a 60 years old male who comes in for evaluation of Type 2 Diabetes Mellitus.  He has not been enrolled in the "Diabetic Education Program".  He states understanding of dietary principles and is following his diet appropriately.  No sensory loss is reported.  Self foot exams are not being performed.  He is checking home blood sugars.  He says that he is exercising.  Type of exercise includes: running.  Duration of exercise is estimated to be 30 min.  He is doing this 3 times per week.        Other questions/concerns include: pt did go to see the nutritionalist but he did not keep following the calorie counting protocal as he was taught.  No changes have been made to his treatment plan since last visit.    Lipid Management History:      Positive NCEP/ATP III risk factors include male age 12 years old or older and diabetes.  Negative NCEP/ATP III risk factors  include HDL cholesterol greater than 60, non-tobacco-user status, non-hypertensive, no ASHD (atherosclerotic heart disease), no prior stroke/TIA, no peripheral vascular disease, and no history of aortic aneurysm.        The patient states that he does not know about the "Therapeutic Lifestyle Change" diet.  The patient does not know about adjunctive measures for cholesterol lowering.  He expresses no side effects from his lipid-lowering medication.  The patient denies any symptoms to suggest myopathy or liver disease.     Habits & Providers  Alcohol-Tobacco-Diet     Alcohol drinks/day: 0     Tobacco Status: never  Exercise-Depression-Behavior     Does Patient Exercise: yes     Exercise Counseling: not indicated; exercise is adequate     Type of exercise: running     Exercise (avg: min/session): 30 min.     Times/week: 3     Depression Counseling: going to a couselor  Drug Use: never  Allergies: No Known Drug Allergies  Review of Systems CV:  Denies chest pain or discomfort. Resp:  Denies cough. GI:  Denies abdominal pain, nausea, and vomiting. GU:  Complains of erectile dysfunction; pt is requesting viagra or cialis. Derm:  Complains of lesion(s); left flank.  Physical Exam  General:  alert.   Head:  normocephalic and male-pattern balding.   Eyes:  pupils round.   Lungs:  normal breath sounds.   Heart:  normal rate and regular rhythm.   Abdomen:  normal bowel sounds.   Msk:  up to the exam table Neurologic:  alert & oriented X3.   Skin:  left flank - approx 1cm x 7mm raised pendulous, irregular shape and color worrisome lesion Psych:  Oriented X3.    Diabetes Management Exam:       Nails:          Left foot: normal          Right foot: normal   Impression & Recommendations:  Problem # 1:  DIABETES MELLITUS, TYPE II, ON INSULIN (ICD-250.00) Hbga1c = 8.1 wanted to change pt to lantus but after conversing with pt and his sister decided to keep pt on current  insulin regimen discussed with pt/sister that pt is NOT checking blood sugars at least 3 times per day as provider has asked so I am unable to see the fluctuations in blood sugars Sister is requesting pt be referred for possible insulin pump assessment.  Advised that pt is not adhering to guidelines from current provider. he is to keep blood sugar log at least for the next 2 weeks three times a day then bring back to this office     Novolin 70/30 70-30 % Susp (Insulin isophane & regular) .Marland KitchenMarland KitchenMarland KitchenMarland Kitchen 30 units subcutaneously in the morning and 30 units subcutaneously at night for diabetes    Bayer Childrens Aspirin 81 Mg Chew (Aspirin) ..... One tablet by mouth daily  Orders: Capillary Blood Glucose/CBG (82948) Hemoglobin A1C (83036)  Problem # 2:  HYPOTHYROIDISM (ICD-244.9) continue as per last visit pt is doing well on current meds His updated medication list for this problem includes:    Levothyroxine Sodium 50 Mcg Tabs (Levothyroxine sodium) ..... One tablet by mouth daily for thyroid  Problem # 3:  SKIN DISORDER (ICD-709.9) worrisome area noted to left flank - will refer to dermatology Orders: Dermatology Referral (Derma)  Complete Medication List: 1)  Novolin 70/30 70-30 % Susp (Insulin isophane & regular) .... 30 units subcutaneously in the morning and 30 units subcutaneously at night for diabetes 2)  Blood Glucose Meter Kit (Blood glucose monitoring suppl) .... Dispense to check blood sugar twice daily 3)  Lancets Misc (Lancets) .... Use to check blood sugar twice daily 4)  Blood Glucose Test Strp (Glucose blood) .... Use to check blood sugar twice daily 5)  Levothyroxine Sodium 50 Mcg Tabs (Levothyroxine sodium) .... One tablet by mouth daily for thyroid 6)  Pravastatin Sodium 10 Mg Tabs (Pravastatin sodium) .... One tablet by mouth nightly for cholesterol 7)  Bayer Childrens Aspirin 81 Mg Chew (Aspirin) .... One tablet by mouth daily  Other Orders: Pneumococcal Vaccine  (30865) Admin 1st Vaccine (78469) Admin 1st Vaccine [facility]) [phone])  Diabetes Management Assessment/Plan:      The following lipid goals have been established for the patient: Total cholesterol goal of 200; LDL cholesterol goal of 100; HDL cholesterol goal of 40; Triglyceride goal of 150.  His blood pressure goal is < 130/80.  Lipid Assessment/Plan:      Based on NCEP/ATP III, the patient's risk factor category is "history of diabetes".  The patient's lipid goals are as follows: Total cholesterol goal is 200; LDL cholesterol goal is 100; HDL cholesterol goal is 40; Triglyceride goal is 150.    Patient Instructions: 1)  You have recevied your pneumovax today.  It will be good until age 58. 2)  This office will refer you to dermatology for lesion on your left flank. you will be informed of this time/date of appointment 3)  Blood sugar log - keep track of your blood sugar  4)  Breakfast, Lunch, and dinner DAILY 5)  Follow up in  2weeks for medication review - insulin 6)  Bring your blood sugar log with you 7)  May need to increase insulin dose if necessary Prescriptions: JANUMET 50-500 MG TABS (SITAGLIPTIN-METFORMIN HCL) One tablet by mouth two times a day for blood sugar  #60 x 5   Entered and Authorized by:   Lehman Prom FNP   Signed by:   Lehman Prom FNP on 09/12/2009   Method used:   Print then Give to Patient   RxID:   [id number] LANTUS 100 UNIT/ML SOLN (INSULIN GLARGINE) 20 units subcutaneously nightly for diabetes  #1 month qs x 1   Entered and Authorized by:   Lehman Prom FNP   Signed by:   Lehman Prom FNP on 09/12/2009   Method used:   Print then Give to Patient   RxID:   [id number]   Laboratory Results   Blood Tests   Date/Time Received: September 12, 2009 2:57 PM   HGBA1C: 8.1%   (Normal Range: Non-Diabetic - 3-6%   Control Diabetic - 6-8%) CBG Random:: 123mg /dL     Diabetic Foot Exam Foot Inspection Is there a history of a foot ulcer?               No Is there a foot ulcer now?              No Can the patient see the bottom of their feet?          No Are the shoes appropriate in style and fit?          Yes Is there swelling or an abnormal foot shape?          No Are the toenails long?                No Are the toenails thick?                No Are the toenails ingrown?              No Is there heavy callous build-up?              No Is there pain in the calf muscle (Intermittent claudication) when walking?    NoIs there a claw toe deformity?              No Is there elevated skin temperature?            No Is there limited ankle dorsiflexion?            No Is there foot or ankle muscle weakness?            No  Diabetic Foot Care Education Pulse Check          Right Foot          Left Foot Dorsalis  Pedis:        normal            normal   Prevention & Chronic Care Immunizations   Influenza vaccine: Fluvax 3+  (04/25/2009)   Influenza vaccine deferral: Refused  (03/05/2009)    Tetanus booster: 03/05/2009: Tdap    Pneumococcal vaccine: Pneumovax  (09/12/2009)   Pneumococcal vaccine deferral: Refused  (06/03/2009)  Colorectal Screening   Hemoccult: Not documented    Colonoscopy: Not documented   Colonoscopy action/deferral: Refused  (03/05/2009)  Other Screening   PSA: 0.84  (02/08/2009)   PSA action/deferral: Discussed-PSA requested  (03/05/2009)   PSA due due: 02/08/2010   Smoking status: never  (09/12/2009)  Diabetes Mellitus   HgbA1C: 8.1  (09/12/2009)   HgbA1C action/deferral: Ordered  (03/05/2009)   Hemoglobin A1C due: 05/11/2009    Eye exam: Not documented    Foot exam: yes  (06/03/2009)   Foot exam action/deferral: Do today   High risk foot: Not documented   Foot care education: Not documented   Foot exam due: 08/31/2009    Urine microalbumin/creatinine ratio: Not documented   Urine microalbumin action/deferral: Deferred  Lipids   Total Cholesterol: 193  (06/17/2009)   Lipid panel  action/deferral: Lipid Panel ordered   LDL: 104  (06/17/2009)   LDL Direct: Not documented   HDL: 66  (06/17/2009)   Triglycerides: 117  (06/17/2009)    SGOT (AST): 20  (02/08/2009)   SGPT (ALT): 21  (02/08/2009)   Alkaline phosphatase: 95  (02/08/2009)   Total bilirubin: 1.0  (02/08/2009)  Self-Management Support :    Diabetes self-management support: Not documented    Lipid self-management support: Not documented    Pneumovax Vaccine    Vaccine Type: Pneumovax    Site: right deltoid    Mfr: Merck    Dose: 0.5 ml    Route: IM    Given by: Vesta Mixer CMA    Exp. Date: 04/05/2011    Lot #: [id number]    VIS given: 11/02/95 version given September 12, 2009.

## 2010-05-08 NOTE — Letter (Signed)
Summary: Lipid Letter  [facility]  [address] [place], [place] [address]   Phone: [phone]  Fax: [phone]    02/17/2010  Cory [name] [address] [place], [place]  [address]  Dear Cory [name]:  We have carefully reviewed your last lipid profile from 02/14/2010 and the results are noted below with a summary of recommendations for lipid management.    Cholesterol:       182     Goal: less than 200   HDL "good" Cholesterol:   57     Goal: greater than 40   LDL "bad" Cholesterol:   115     Goal: less than 70   Triglycerides:       48     Goal: less than 150  Cholesterol labs ok during recent office visit.  Thyroid lab is just slightly higher than it should be but will monitor for now.  An increase in medication at this time may convert you to HYPERthyroid (too much) status.  Your thyroid lab will be rechecked in 3 months.     Current Medications: 1)    Novolog Mix 70/30 70-30 % Susp (Insulin aspart prot & aspart) .... 28 units subcutaneously before breakfast and  dinner (waiting for med from map) 2)    Blood Glucose Meter  Kit (Blood glucose monitoring suppl) .... Dispense to check blood sugar twice daily 3)    Lancets  Misc (Lancets) .... Use to check blood sugar twice daily 4)    Blood Glucose Test  Strp (Glucose blood) .... Use to check blood sugar twice daily 5)    Levothyroxine Sodium 50 Mcg Tabs (Levothyroxine sodium) .... One tablet by mouth daily for thyroid 6)    Pravastatin Sodium 10 Mg Tabs (Pravastatin sodium) .... One tablet by mouth nightly for cholesterol 7)    Bayer Childrens Aspirin 81 Mg Chew (Aspirin) .... One tablet by mouth daily 8)    Lantus 100 Unit/ml Soln (Insulin glargine) .Marland Kitchen.. 12 units subcutaneously nightly 9)    Lisinopril 10 Mg Tabs (Lisinopril) .... One tablet by mouth daily for blood pressure 10)    Novolin R 100 Unit/ml Soln (Insulin regular human) .... 5 units subcutaneously two times a day 11)     Novolin N 100 Unit/ml Susp (Insulin isophane human) .Marland Kitchen.. 15 units in the am and 13 units in the pm  If you have any questions, please call. We appreciate being able to work with you.   Sincerely,    [facility] Cory Prom FNP

## 2010-05-08 NOTE — Letter (Signed)
Summary: *HSN Results Follow up  [facility]  [address] [place], [place] [address]   Phone: [phone]  Fax: [phone]      02/24/2010   KINDRICK LANKFORD [address] [address] [place], [place]  [address]   Dear  Mr. JONOTHAN HEBERLE,                            ____S.Drinkard,FNP   ____D. Gore,FNP       ____B. McPherson,MD   ____V. Rankins,MD    ____E. Mulberry,MD    ____N. Daphine Deutscher, FNP  ____D. Reche Dixon, MD    ____K. Philipp Deputy, MD    ____Other     This letter is to inform you that your recent test(s):  _______Pap Smear    _______Lab Test     _______X-ray    _______ is within acceptable limits  _______ requires a medication change  _______ requires a follow-up lab visit  _______ requires a follow-up visit with your provider   Comments:  We have been trying to reach you at [phone].  Please contact the office at your earliest convenience.       _________________________________________________________ If you have any questions, please contact our office                     Sincerely,  Levon Hedger [facility]

## 2010-05-08 NOTE — Assessment & Plan Note (Signed)
Summary: Diabetes   Vital Signs:  Patient profile:   60 year old male Weight:      201.8 pounds BMI:     27.66 Temp:     98.2 degrees F oral Pulse rate:   72 / minute Pulse rhythm:   regular Resp:     16 per minute BP sitting:   158 / 78  (left arm) Cuff size:   regular  Vitals Entered By: Isla Pence (October 11, 2009 2:57 PM)  Nutrition Counseling: Patient's BMI is greater than 25 and therefore counseled on weight management options. CC: 1 month follow-up, Lipid Management Is Patient Diabetic? Yes Pain Assessment Patient in pain? no      CBG Result 177 CBG Device ID A  Does patient need assistance? Functional Status Self care Ambulation Normal   CC:  1 month follow-up and Lipid Management.  History of Present Illness:  Pt into the office for diabetes pt was seen in this office 2 weeks ago  Pt was started on lantus 10 units and novolog 28 units two times a day  Pt presents today with his blood sugar log - he has been checking his blood sugar at least twice per day He has not had any hypoglycemic episodes  Diabetes Management History:      The patient is a 60 years old male who comes in for evaluation of Type 2 Diabetes Mellitus.  He has not been enrolled in the "Diabetic Education Program".  He states understanding of dietary principles but he is not following the appropriate diet.  No sensory loss is reported.  Self foot exams are not being performed.  He is checking home blood sugars.  He says that he is exercising.  Type of exercise includes: running.  Duration of exercise is estimated to be 30 min.  He is doing this 3 times per week.        Hypoglycemic symptoms are not occurring.  No hyperglycemic symptoms are reported.        There are no symptoms to suggest diabetic complications.  The following changes have been made to his treatment plan since last visit: insulin dosing.  Treatment plan changes were initiated by MD.    Lipid Management History:  Positive NCEP/ATP III risk factors include male age 34 years old or older and diabetes.  Negative NCEP/ATP III risk factors include HDL cholesterol greater than 60, non-tobacco-user status, non-hypertensive, no ASHD (atherosclerotic heart disease), no prior stroke/TIA, no peripheral vascular disease, and no history of aortic aneurysm.        The patient states that he does not know about the "Therapeutic Lifestyle Change" diet.  The patient does not know about adjunctive measures for cholesterol lowering.  He expresses no side effects from his lipid-lowering medication.  The patient denies any symptoms to suggest myopathy or liver disease.     Habits & Providers  Alcohol-Tobacco-Diet     Alcohol drinks/day: 0     Tobacco Status: never  Exercise-Depression-Behavior     Does Patient Exercise: yes     Exercise Counseling: not indicated; exercise is adequate     Type of exercise: running     Exercise (avg: min/session): 30 min.     Times/week: 3     Depression Counseling: going to a couselor     Drug Use: never  Allergies: No Known Drug Allergies  Review of Systems ENT:  Denies earache. CV:  Denies chest pain or discomfort. Resp:  Denies cough. GI:  Denies abdominal pain, nausea, and vomiting. Endo:  Denies excessive thirst and excessive urination.  Physical Exam  General:  alert.   Head:  normocephalic and male-pattern balding.   Lungs:  normal breath sounds.   Heart:  normal rate and regular rhythm.   Abdomen:  normal bowel sounds.   Msk:  up to the exam table Neurologic:  alert & oriented X3.   Skin:  color normal.   Psych:  Oriented X3.     Impression & Recommendations:  Problem # 1:  DIABETES MELLITUS, TYPE II, ON INSULIN (ICD-250.00) continue to check blood sugar three times a day  will continue insulin at current doses His updated medication list for this problem includes:    Novolin 70/30 70-30 % Susp (Insulin isophane & regular) .Marland Kitchen... 28 units subcutaneously in  the morning and 28 units subcutaneously at night for diabetes    Bayer Childrens Aspirin 81 Mg Chew (Aspirin) ..... One tablet by mouth daily    Lantus 100 Unit/ml Soln (Insulin glargine) .Marland KitchenMarland KitchenMarland KitchenMarland Kitchen 10 units subcutaneously nightly  Orders: Capillary Blood Glucose/CBG RC:[id number])  Problem # 2:  HYPERCHOLESTEROLEMIA (ICD-272.0)  His updated medication list for this problem includes:    Pravastatin Sodium 10 Mg Tabs (Pravastatin sodium) ..... One tablet by mouth nightly for cholesterol  Problem # 3:  HYPOTHYROIDISM (ICD-244.9)  His updated medication list for this problem includes:    Levothyroxine Sodium 50 Mcg Tabs (Levothyroxine sodium) ..... One tablet by mouth daily for thyroid  Problem # 4:  ELEVATED BLOOD PRESSURE WITHOUT DIAGNOSIS OF HYPERTENSION (ICD-796.2) elevated today advised pt to monitor diet low sodium diet  Complete Medication List: 1)  Novolin 70/30 70-30 % Susp (Insulin isophane & regular) .... 28 units subcutaneously in the morning and 28 units subcutaneously at night for diabetes 2)  Blood Glucose Meter Kit (Blood glucose monitoring suppl) .... Dispense to check blood sugar twice daily 3)  Lancets Misc (Lancets) .... Use to check blood sugar twice daily 4)  Blood Glucose Test Strp (Glucose blood) .... Use to check blood sugar twice daily 5)  Levothyroxine Sodium 50 Mcg Tabs (Levothyroxine sodium) .... One tablet by mouth daily for thyroid 6)  Pravastatin Sodium 10 Mg Tabs (Pravastatin sodium) .... One tablet by mouth nightly for cholesterol 7)  Bayer Childrens Aspirin 81 Mg Chew (Aspirin) .... One tablet by mouth daily 8)  Lantus 100 Unit/ml Soln (Insulin glargine) .Marland Kitchen.. 10 units subcutaneously nightly  Diabetes Management Assessment/Plan:      The following lipid goals have been established for the patient: Total cholesterol goal of 200; LDL cholesterol goal of 100; HDL cholesterol goal of 40; Triglyceride goal of 150.  His blood pressure goal is < 130/80.    Lipid  Assessment/Plan:      Based on NCEP/ATP III, the patient's risk factor category is "history of diabetes".  The patient's lipid goals are as follows: Total cholesterol goal is 200; LDL cholesterol goal is 100; HDL cholesterol goal is 40; Triglyceride goal is 150.    Patient Instructions: 1)  Diabetes - continue to check your blood sugar 2)  Continue the lantus at 10 units 3)  Continue the Novolog at 28 units two times a day  4)  REMEMBER THE MOST IMPORTANT THING IS YOUR DIET 5)  Your blood pressure is 158/78 today. 6)  This is elevated. Goal should be 120/80 7)  If still elevated on next visit will need to start a low dose of medications. 8)  Follow up in 1 month  for diabetes. 9)  Bring your blood sugar log with you into the office

## 2010-05-08 NOTE — Letter (Signed)
Summary: Lipid Letter  [facility]  [address] [place], [place] [address]   Phone: [phone]  Fax: [phone]    03/06/2009  [facility] [address] [place], [place]  [address]  Dear Cory [name]:  We have carefully reviewed your last lipid profile from 03/05/2009 and the results are noted below with a summary of recommendations for lipid management.    Cholesterol:       216     Goal: less than 200   HDL "good" Cholesterol:   69     Goal: greater than 40   LDL "bad" Cholesterol:   120     Goal: less than 70   Triglycerides:       134     Goal: less than 150    Recent labs show that your cholesterol is high.  You should have been contacted by this office about starting medication.  You should also start a low fat, low cholesterol.     Current Medications: 1)    Novolin 70/30 70-30 % Susp (Insulin isophane & regular) .... 32 units subcutaneously in the morning and 32 units subcutaneously at night for diabetes 2)    Blood Glucose Meter  Kit (Blood glucose monitoring suppl) .... Dispense to check blood sugar twice daily 3)    Lancets  Misc (Lancets) .... Use to check blood sugar twice daily 4)    Blood Glucose Test  Strp (Glucose blood) .... Use to check blood sugar twice daily 5)    Levothyroxine Sodium 25 Mcg Tabs (Levothyroxine sodium) .... One tablet by mouth daily for thyroid 6)    Pravastatin Sodium 10 Mg Tabs (Pravastatin sodium) .... One tablet by mouth nightly for cholesterol  If you have any questions, please call. We appreciate being able to work with you.   Sincerely,    Lehman Prom, FNP [facility]

## 2010-05-08 NOTE — Assessment & Plan Note (Signed)
Summary: Diabetes   Vital Signs:  Patient profile:   60 year old male Weight:      210.5 pounds BMI:     28.85 Temp:     97.5 degrees F oral Pulse rate:   64 / minute Pulse rhythm:   regular Resp:     16 per minute BP sitting:   150 / 90  (left arm) Cuff size:   regular  Vitals Entered By: Levon Hedger (February 14, 2010 9:07 AM)  Nutrition Counseling: Patient's BMI is greater than 25 and therefore counseled on weight management options. CC: Hypertension Management, Lipid Management Is Patient Diabetic? No Pain Assessment Patient in pain? no      CBG Result 76 CBG Device ID B  Does patient need assistance? Functional Status Self care Ambulation Normal   CC:  Hypertension Management and Lipid Management.  History of Present Illness:  Pt into the office for f/u on diabetes  Obesity - gained 5 pounds since last visit here. admits that activity has decreased  Sister present with pt during the exam.  She is assiting with his care and making sure he has all his medications. Pt has a continuous monitor that he uses to check blood sugar hourly.  Diabetes Management History:      The patient is a 60 years old male who comes in for evaluation of Type 2 Diabetes Mellitus.  He has not been enrolled in the "Diabetic Education Program".  He states understanding of dietary principles but he is not following the appropriate diet.  No sensory loss is reported.  Self foot exams are not being performed.  He is checking home blood sugars.  He says that he is exercising.  Type of exercise includes: biking.  Duration of exercise is estimated to be 30 min.  He is doing this 3 times per week.        Hypoglycemic symptoms are not occurring.  No hyperglycemic symptoms are reported.        No changes have been made to his treatment plan since last visit.    Hypertension History:      He denies headache, chest pain, and palpitations.  He notes no problems with any antihypertensive medication  side effects.        Positive major cardiovascular risk factors include male age 37 years old or older, diabetes, hyperlipidemia, and hypertension.  Negative major cardiovascular risk factors include non-tobacco-user status.        Further assessment for target organ damage reveals no history of ASHD, cardiac end-organ damage (CHF/LVH), stroke/TIA, peripheral vascular disease, renal insufficiency, or hypertensive retinopathy.    Lipid Management History:      Positive NCEP/ATP III risk factors include male age 41 years old or older, diabetes, and hypertension.  Negative NCEP/ATP III risk factors include HDL cholesterol greater than 60, non-tobacco-user status, no ASHD (atherosclerotic heart disease), no prior stroke/TIA, no peripheral vascular disease, and no history of aortic aneurysm.        The patient states that he does not know about the "Therapeutic Lifestyle Change" diet.  The patient does not know about adjunctive measures for cholesterol lowering.      Habits & Providers  Alcohol-Tobacco-Diet     Alcohol drinks/day: 0     Tobacco Status: never  Exercise-Depression-Behavior     Does Patient Exercise: yes     Exercise Counseling: not indicated; exercise is adequate     Type of exercise: biking     Exercise (avg: min/session):  30 min.     Times/week: 3     Depression Counseling: going to a counselor     Drug Use: never  Allergies (verified): No Known Drug Allergies  Review of Systems General:  Denies fever. CV:  Denies fatigue. Resp:  Denies cough. GI:  Denies abdominal pain, nausea, and vomiting.  Physical Exam  General:  alert.   Head:  normocephalic.   Lungs:  normal breath sounds.   Heart:  normal rate and regular rhythm.   Abdomen:  normal bowel sounds.   Neurologic:  gait normal.     Impression & Recommendations:  Problem # 1:  DIABETES MELLITUS, TYPE II, ON INSULIN (ICD-250.00) Hbga1c is improved pt to continue to monitor labs with continuous  monitor still with some early morning hypoglycemic episodes - 30's and 40's  His updated medication list for this problem includes:    Novolog Mix 70/30 70-30 % Susp (Insulin aspart prot & aspart) .Marland Kitchen... 28 units subcutaneously before breakfast and  dinner (waiting for med from map)    Bayer Childrens Aspirin 81 Mg Chew (Aspirin) ..... One tablet by mouth daily    Lantus 100 Unit/ml Soln (Insulin glargine) .Marland Kitchen... 12 units subcutaneously nightly    Lisinopril 10 Mg Tabs (Lisinopril) ..... One tablet by mouth daily for blood pressure    Novolin R 100 Unit/ml Soln (Insulin regular human) .Marland KitchenMarland KitchenMarland KitchenMarland Kitchen 5 units subcutaneously two times a day    Novolin N 100 Unit/ml Susp (Insulin isophane human) .Marland KitchenMarland KitchenMarland KitchenMarland Kitchen 15 units in the am and 13 units in the pm  Orders: Capillary Blood Glucose/CBG ([address])  Problem # 2:  HYPERTENSION, BENIGN ESSENTIAL (ICD-401.1) elevated on consecutive visits will start med DASH diet  His updated medication list for this problem includes:    Lisinopril 10 Mg Tabs (Lisinopril) ..... One tablet by mouth daily for blood pressure  Problem # 3:  HYPERCHOLESTEROLEMIA (ICD-272.0) will check labs  pt not taking meds but advised is labs still elevated he will need to start His updated medication list for this problem includes:    Pravastatin Sodium 10 Mg Tabs (Pravastatin sodium) ..... One tablet by mouth nightly for cholesterol  Orders: T-Lipid Profile ([id number])  Problem # 4:  HYPOTHYROIDISM (ICD-244.9) will check labs His updated medication list for this problem includes:    Levothyroxine Sodium 50 Mcg Tabs (Levothyroxine sodium) ..... One tablet by mouth daily for thyroid  Orders: T-TSH ([id number]) T-T3, Free [phone])  Complete Medication List: 1)  Novolog Mix 70/30 70-30 % Susp (Insulin aspart prot & aspart) .... 28 units subcutaneously before breakfast and  dinner (waiting for med from map) 2)  Blood Glucose Meter Kit (Blood glucose monitoring suppl) .... Dispense to  check blood sugar twice daily 3)  Lancets Misc (Lancets) .... Use to check blood sugar twice daily 4)  Blood Glucose Test Strp (Glucose blood) .... Use to check blood sugar twice daily 5)  Levothyroxine Sodium 50 Mcg Tabs (Levothyroxine sodium) .... One tablet by mouth daily for thyroid 6)  Pravastatin Sodium 10 Mg Tabs (Pravastatin sodium) .... One tablet by mouth nightly for cholesterol 7)  Bayer Childrens Aspirin 81 Mg Chew (Aspirin) .... One tablet by mouth daily 8)  Lantus 100 Unit/ml Soln (Insulin glargine) .Marland Kitchen.. 12 units subcutaneously nightly 9)  Lisinopril 10 Mg Tabs (Lisinopril) .... One tablet by mouth daily for blood pressure 10)  Novolin R 100 Unit/ml Soln (Insulin regular human) .... 5 units subcutaneously two times a day 11)  Novolin N 100 Unit/ml Susp (Insulin isophane  human) .Marland Kitchen.. 15 units in the am and 13 units in the pm  Other Orders: T-Free T4 ([address])  Diabetes Management Assessment/Plan:      The following lipid goals have been established for the patient: Total cholesterol goal of 200; LDL cholesterol goal of 100; HDL cholesterol goal of 40; Triglyceride goal of 150.  His blood pressure goal is < 130/80.    Hypertension Assessment/Plan:      The patient's hypertensive risk group is category C: Target organ damage and/or diabetes.  His calculated 10 year risk of coronary heart disease is 18 %.  Today's blood pressure is 150/90.  His blood pressure goal is < 130/80.  Lipid Assessment/Plan:      Based on NCEP/ATP III, the patient's risk factor category is "history of diabetes".  The patient's lipid goals are as follows: Total cholesterol goal is 200; LDL cholesterol goal is 100; HDL cholesterol goal is 40; Triglyceride goal is 150.  His LDL cholesterol goal has not been met.    Patient Instructions: 1)  Your labs will be checked today and you will be notified of the results. 2)  Morning R 5 units/N 15 units 3)  Afternoon 5 units/N 13 units 4)  night lantus 12 units 5)   Blood pressure - elevated today.  Start lisinopril 10mg  by mouth daily 6)  Follow up in 2 weeks for a blood pressure check (triage visit) 7)  Take your blood pressure medications before this visit. 8)  Bring blood sugar log into the office  9)  Follow up in 2 months with n.martin,fnp for diabetes. Prescriptions: LISINOPRIL 10 MG TABS (LISINOPRIL) One tablet by mouth daily for blood pressure  #30 x 5   Entered and Authorized by:   Lehman Prom FNP   Signed by:   Lehman Prom FNP on 02/14/2010   Method used:   Print then Give to Patient   RxID:   [id number]    Orders Added: 1)  Capillary Blood Glucose/CBG [82948] 2)  Est. Patient Level III [01027] 3)  T-Lipid Profile [80061-22930] 4)  T-TSH [25366-44034] 5)  T-T3, Free [74259-56387] 6)  T-Free T4 [23300]    Laboratory Results   Blood Tests   Date/Time Received: February 14, 2010 9:52 AM   HGBA1C: 6.7%   (Normal Range: Non-Diabetic - 3-6%   Control Diabetic - 6-8%) CBG Random:: 76mg /dL      Appended Document: Diabetes    Clinical Lists Changes  Problems: Added new problem of DENTAL CARIES (ICD-521.00)

## 2010-05-08 NOTE — Miscellaneous (Signed)
Summary: Derm update  Clinical Lists Changes  Problems: Changed problem from SKIN DISORDER (ICD-709.9) to SEBORRHEIC KERATOSES, HX OF (ICD-V13.3) - removed by derm on 10/17/2009

## 2010-05-08 NOTE — Letter (Signed)
Summary: *Referral Letter  [facility]  [address] [place], [place] [address]   Phone: [phone]  Fax: [phone]    02/08/2009  Manas Hickling [address] [place], [place]  [address]  Phone: [phone]  To whom it may concern  Mr. Huhn established as a patient in this office today.  He has recieved an initial evaluation and will be followed at regular intervals for his chronic medication conditions.  Current Medical Problems: 1)  HYPOTHYROIDISM (ICD-244.9) 2)  DIABETES MELLITUS, TYPE II, ON INSULIN (ICD-250.00)   Current Medications: 1)  NOVOLOG MIX 70/30 70-30 % SUSP (INSULIN ASPART PROT & ASPART) 32 units in the morning and 32 units at night subcutaneously for diabetes 2)  BLOOD GLUCOSE METER  KIT (BLOOD GLUCOSE MONITORING SUPPL) Dispense to check blood sugar twice daily 3)  LANCETS  MISC (LANCETS) Use to check blood sugar twice daily 4)  BLOOD GLUCOSE TEST  STRP (GLUCOSE BLOOD) Use to check blood sugar twice daily   Please contact us if you have any further questions or need additional information.  Sincerely,    Lehman Prom FNP [facility]

## 2010-05-08 NOTE — Assessment & Plan Note (Signed)
Summary: Diabetes   Vital Signs:  Patient profile:   60 year old male Weight:      203.2 pounds BMI:     27.85 Temp:     97.4 degrees F oral Pulse rate:   60 / minute Pulse rhythm:   regular Resp:     16 per minute BP sitting:   136 / 80  (left arm) Cuff size:   regular  Vitals Entered By: Levon Hedger (November 12, 2009 9:19 AM)  Nutrition Counseling: Patient's BMI is greater than 25 and therefore counseled on weight management options. CC: follow-up visit 1 month, Lipid Management Is Patient Diabetic? Yes Pain Assessment Patient in pain? no      CBG Result 36 CBG Device ID B  Does patient need assistance? Functional Status Self care Ambulation Normal Comments recheck CBG it was 54 and 2 glucose tabs were given to pt.   CC:  follow-up visit 1 month and Lipid Management.  History of Present Illness:  Pt into the office for diabetes f/u. Insulin has been titrated on the past 2-3 visits. Pt continues to need diabetic education for instance today he took full dose of insulin and did not eat before this visit. Hypoglycemic today in office. Pt ate a snack while in office.  No hypoglycemic episodes.  Elevated blood pressure - better today  no acute problems today Pt recently returned from a beach trip with friends. Pt has finished his course at [facility]  Dermatology - Pt has gone to dermatology as ordered.  Area in question was removed.  Diabetes Management History:      The patient is a 60 years old male who comes in for evaluation of Type 2 Diabetes Mellitus.  He has not been enrolled in the "Diabetic Education Program".  He states lack of understanding of dietary principles and is not following his diet appropriately.  No sensory loss is reported.  Self foot exams are not being performed.  He is checking home blood sugars.  He says that he is exercising.  Type of exercise includes: running.  Duration of exercise is estimated to be 30 min.  He is doing this 3 times per  week.        Hypoglycemic symptoms are not occurring.  No hyperglycemic symptoms are reported.  Other comments include: Pt has been ordered 70/30 however he reports that he is taking regular and NPH (per his own suggestion).        The following changes have been made to his treatment plan since last visit: insulin dosing.  Treatment plan changes were initiated by patient.    Lipid Management History:      Positive NCEP/ATP III risk factors include male age 93 years old or older and diabetes.  Negative NCEP/ATP III risk factors include HDL cholesterol greater than 60, non-tobacco-user status, non-hypertensive, no ASHD (atherosclerotic heart disease), no prior stroke/TIA, no peripheral vascular disease, and no history of aortic aneurysm.        The patient states that he does not know about the "Therapeutic Lifestyle Change" diet.  The patient does not know about adjunctive measures for cholesterol lowering.  He expresses no side effects from his lipid-lowering medication.  The patient denies any symptoms to suggest myopathy or liver disease.      Habits & Providers  Alcohol-Tobacco-Diet     Alcohol drinks/day: 0     Tobacco Status: never  Exercise-Depression-Behavior     Does Patient Exercise: yes  Exercise Counseling: not indicated; exercise is adequate     Type of exercise: biking     Exercise (avg: min/session): 30 min.     Times/week: 3     Depression Counseling: going to a couselor     Drug Use: never  Diabetic Foot Exam Foot Inspection Is there a history of a foot ulcer?              No Is there a foot ulcer now?              No Can the patient see the bottom of their feet?          No Are the shoes appropriate in style and fit?          No Is there swelling or an abnormal foot shape?          No Are the toenails long?                No Are the toenails thick?                No Are the toenails ingrown?              No Is there heavy callous build-up?              No Is  there pain in the calf muscle (Intermittent claudication) when walking?    NoIs there a claw toe deformity?              No Is there elevated skin temperature?            No Is there limited ankle dorsiflexion?            No Is there foot or ankle muscle weakness?            No  Diabetic Foot Care Education Patient educated on appropriate care of diabetic feet.  Pulse Check          Right Foot          Left Foot Dorsalis Pedis:        normal            normal    10-g (5.07) Semmes-Weinstein Monofilament Test Performed by: Levon Hedger          Right Foot          Left Foot Visual Inspection                Allergies (verified): No Known Drug Allergies  Review of Systems General:  Denies fever. CV:  Denies chest pain or discomfort. Resp:  Denies cough. GI:  Denies abdominal pain, nausea, and vomiting.  Physical Exam  General:  well-developed.   Head:  male-pattern balding.   Lungs:  normal breath sounds.   Heart:  normal rate and regular rhythm.   Abdomen:  normal bowel sounds.   Msk:  normal ROM.   Neurologic:  alert & oriented X3.   Skin:  left flank - scabbed area from derm procedure with slight erythematous borders Psych:  Oriented X3.    Diabetes Management Exam:    Foot Exam (with socks and/or shoes not present):       Sensory-Monofilament:          Left foot: normal          Right foot: normal       Inspection:          Left foot: normal  Right foot: normal       Nails:          Left foot: normal          Right foot: normal   Impression & Recommendations:  Problem # 1:  DIABETES MELLITUS, TYPE II, ON INSULIN (ICD-250.00) Pt advised to take insulin as ordered instead of buying over the counter insulin will check hgba1c at next visit His updated medication list for this problem includes:    Novolin 70/30 70-30 % Susp (Insulin isophane & regular) .Marland Kitchen... 28 units subcutaneously in the morning and 28 units subcutaneously at night for diabetes     Bayer Childrens Aspirin 81 Mg Chew (Aspirin) ..... One tablet by mouth daily    Lantus 100 Unit/ml Soln (Insulin glargine) .Marland Kitchen... 12 units subcutaneously nightly  Orders: Capillary Blood Glucose/CBG (08657)  Problem # 2:  ELEVATED BLOOD PRESSURE WITHOUT DIAGNOSIS OF HYPERTENSION (ICD-796.2) Blood pressure is better today  Problem # 3:  HYPERCHOLESTEROLEMIA (ICD-272.0) advised pt to take meds as ordered His updated medication list for this problem includes:    Pravastatin Sodium 10 Mg Tabs (Pravastatin sodium) ..... One tablet by mouth nightly for cholesterol  Problem # 4:  HYPOTHYROIDISM (ICD-244.9) continue current medications His updated medication list for this problem includes:    Levothyroxine Sodium 50 Mcg Tabs (Levothyroxine sodium) ..... One tablet by mouth daily for thyroid  Complete Medication List: 1)  Novolin 70/30 70-30 % Susp (Insulin isophane & regular) .... 28 units subcutaneously in the morning and 28 units subcutaneously at night for diabetes 2)  Blood Glucose Meter Kit (Blood glucose monitoring suppl) .... Dispense to check blood sugar twice daily 3)  Lancets Misc (Lancets) .... Use to check blood sugar twice daily 4)  Blood Glucose Test Strp (Glucose blood) .... Use to check blood sugar twice daily 5)  Levothyroxine Sodium 50 Mcg Tabs (Levothyroxine sodium) .... One tablet by mouth daily for thyroid 6)  Pravastatin Sodium 10 Mg Tabs (Pravastatin sodium) .... One tablet by mouth nightly for cholesterol 7)  Bayer Childrens Aspirin 81 Mg Chew (Aspirin) .... One tablet by mouth daily 8)  Lantus 100 Unit/ml Soln (Insulin glargine) .Marland Kitchen.. 12 units subcutaneously nightly  Diabetes Management Assessment/Plan:      The following lipid goals have been established for the patient: Total cholesterol goal of 200; LDL cholesterol goal of 100; HDL cholesterol goal of 40; Triglyceride goal of 150.  His blood pressure goal is < 130/80.    Lipid Assessment/Plan:      Based on NCEP/ATP  III, the patient's risk factor category is "history of diabetes".  The patient's lipid goals are as follows: Total cholesterol goal is 200; LDL cholesterol goal is 100; HDL cholesterol goal is 40; Triglyceride goal is 150.  His LDL cholesterol goal has not been met.    Patient Instructions: 1)  Diabetes - Take insulin as ordered below.  You should be taking 70/30 and lantus. 2)  I don't want you to take the Regular and NPH as this causes an up and down effect to your blood sugar and my concern is that your blood sugar will get too low as it did today in office.   3)  Follow up in 2 months for diabetes -  4)  You will need flu vaccine, hgba1c 5)  Bring your blood sugar log into this office 6)  Continue to monitor blood pressure and if elevated may need medications.  Last LDL:  104 (06/17/2009 10:03:00 PM)        Diabetic Foot Exam Diabetic Foot Care Education :Patient educated on appropriate care of diabetic feet.  Pulse Check          Right Foot          Left Foot Dorsalis Pedis:        normal            normal    10-g (5.07) Semmes-Weinstein Monofilament Test Performed by: Levon Hedger          Right Foot          Left Foot Visual Inspection               Test Control      normal         normal Site 1         normal         normal Site 2         normal         normal Site 3         normal         normal Site 4         normal         normal Site 5         normal         normal Site 6         normal         normal Site 7         normal         normal Site 8         normal         normal Site 9         normal         normal Site 10         normal         normal  Impression      normal         normal

## 2010-05-08 NOTE — Progress Notes (Signed)
Summary: medication refill  Phone Note Call from Patient   Summary of Call: NEEDS LANTUS AND [facility] CALLED INTO [facility] DOESNMarz[facility] Board  [phone] Initial call taken by: Arta Bruce,  December 23, 2009 9:01 AM  Follow-up for Phone Call        forward to N. Daphine Deutscher, fnp Follow-up by: Levon Hedger,  December 23, 2009 11:13 AM  Additional Follow-up for Phone Call Additional follow up Details #1::        medications were sent to the health department on last week Aggie Cosier - thought you spoke with either pt or his sister and they decided to use [facility] pharmacy. meds were faxed there.   Additional Follow-up by: Lehman Prom FNP,  December 23, 2009 2:20 PM    Additional Follow-up for Phone Call Additional follow up Details #2::    From the note, Chantel spoke with them and they agreed to [facility].  Left message on answering machine for pt. to return call.  Will check with pt. to find out where they want the meds to go and then have them transferred.  Dutch Quint RN  December 23, 2009 2:32 PM  Spoke with pt. -- wants insulins to go to [place].  However, they need to provide financial information before they can get the meds.  Left message on answering machine for pt. to return call to inform pt. Dutch Quint RN  December 23, 2009 4:23 PM  Notified [facility] to transfer insulins.  Pt. notified of need for financials -- will call MAP program to find out what he needs.  Dutch Quint RN  December 23, 2009 4:38 PM

## 2010-05-08 NOTE — Assessment & Plan Note (Signed)
Summary: Complete Physical Exam   Vital Signs:  Patient profile:   60 year old male Height:      71.75 inches Weight:      186.7 pounds BMI:     25.59 BSA:     2.07 Temp:     97.7 degrees F oral Pulse rate:   80 / minute Pulse rhythm:   regular Resp:     20 per minute BP sitting:   191 / 83  (left arm) Cuff size:   regular  Vitals Entered By: Levon Hedger (March 05, 2009 10:55 AM) CC: CPE, Depression Is Patient Diabetic? Yes Pain Assessment Patient in pain? no      CBG Result 38 CBG Device ID B  Does patient need assistance? Functional Status Self care Ambulation Normal   CC:  CPE and Depression.  History of Present Illness:  Pt into the office for a complete physical exam.  Labs done on last visit except the lipids which were done today.  Hypothyroidism  - noted on last visit.  Pt has started levothyroid as ordered.  Optho - wears reading glasses but no recent optho exam  dental - no recent dental exam   tetanus - no recent booster in 10 years  Colonscopy - no family hx of colon cancer in the family. Pt has never had a colonscopy and he refuses referral at this time No blood in stool    Depression History:      The patient presents with symptoms of depression which have been present for greater than two weeks.  The patient denies a depressed mood most of the day and a diminished interest in his usual daily activities.  Positive alarm features for depression include insomnia.  However, he denies fatigue (loss of energy), impaired concentration (indecisiveness), and recurrent thoughts of death or suicide.  Positive alarm features for a manic disorder include flight of ideas.        Psychosocial stress factors include a recent marital separation.  The patient denies that he feels like life is not worth living, denies that he wishes that he were dead, and denies that he has thought about ending his life.        Comments:  Recent separation from wife and he  has been attending stress management.  Pt had 5 sessions. 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 Feels like he is at the bottom of the pole. Reports that about 10 years ago he was on wellbutrim but he stopped taking it because he did not feel it was effective.  Diabetes Management History:      He has not been enrolled in the "Diabetic Education Program".  He states understanding of dietary principles and is following his diet appropriately.  No sensory loss is reported.  Self foot exams are not being performed.  He is checking home blood sugars.  He says that he is not exercising regularly.        The following changes have been made to his treatment plan since last visit: insulin dosing.  Treatment plan changes were initiated by MD.     Habits & Providers  Alcohol-Tobacco-Diet     Alcohol drinks/day: 0     Tobacco Status: never  Exercise-Depression-Behavior     Does Patient Exercise: no     Have you felt down or hopeless? no     Have you felt little pleasure in things?  no     Depression Counseling: going to a couselor     Drug Use: never  Allergies (verified): No Known Drug Allergies  Review of Systems General:  Denies fever. Eyes:  Denies blurring. ENT:  Denies earache. CV:  Denies chest pain or discomfort. Resp:  Denies cough. GI:  Denies abdominal pain, constipation, nausea, and vomiting. GU:  Denies discharge and urinary frequency. MS:  Denies joint pain. Derm:  Denies rash; mole to left abd present for 1-2 years but recently growing in size over the past 6 months and has changed color. Neuro:  Denies headaches. Psych:  Complains of easily angered; Pt is going to stress management.  Physical Exam  General:  alert.   Head:  male-pattern balding.   Eyes:  glasses Ears:  Bil ears with clear fluid behing TM - no erythme Nose:  no nasal discharge.   Mouth:  pharynx pink and moist and fair  dentition.   Neck:  supple.   Chest Wall:  no masses.   right healed scar under right breast Lungs:  normal breath sounds.   Heart:  normal rate and regular rhythm.   Msk:  up to the exam table Extremities:  no edema Neurologic:  alert & oriented X3.   Skin:  freckles to chest and back left flank - 1cm, circumscribed, dark in color, non-tender  Psych:  Oriented X3.     Impression & Recommendations:  Problem # 1:  ROUTINE GYNECOLOGICAL EXAMINATION (ICD-V72.31) rec optho and dental  guaiac negative colonscopy recs given Orders: Hemoccult Guaiac-1 spec.(in office) (82270) UA Dipstick w/o Micro (manual) (16109) T-GC Probe, urine ([id number])  Problem # 2:  DIABETES MELLITUS, TYPE II, ON INSULIN (ICD-250.00) insulin increased on last visit f/u in 2 months His updated medication list for this problem includes:    Novolin 70/30 70-30 % Susp (Insulin isophane & regular) .Marland Kitchen... 32 units subcutaneously in the morning and 32 units subcutaneously at night for diabetes  Orders: Capillary Blood Glucose/CBG (91478) T-Lipid Profile (29562-13086)  Problem # 3:  HYPOTHYROIDISM (ICD-244.9) pt has started meds as ordered will recheck in 2 months His updated medication list for this problem includes:    Levothyroxine Sodium 25 Mcg Tabs (Levothyroxine sodium) ..... One tablet by mouth daily for thyroid  Orders: EKG w/ Interpretation (93000)  Problem # 4:  HYPERCHOLESTEROLEMIA (ICD-272.0)  His updated medication list for this problem includes:    Pravastatin Sodium 10 Mg Tabs (Pravastatin sodium) ..... One tablet by mouth nightly for cholesterol  Complete Medication List: 1)  Novolin 70/30 70-30 % Susp (Insulin isophane & regular) .... 32 units subcutaneously in the morning and 32 units subcutaneously at night for diabetes 2)  Blood Glucose Meter Kit (Blood glucose monitoring suppl) .... Dispense to check blood sugar twice daily 3)  Lancets Misc (Lancets) .... Use to check blood sugar  twice daily 4)  Blood Glucose Test Strp (Glucose blood) .... Use to check blood sugar twice daily 5)  Levothyroxine Sodium 25 Mcg Tabs (Levothyroxine sodium) .... One tablet by mouth daily for thyroid 6)  Pravastatin Sodium 10 Mg Tabs (Pravastatin sodium) .... One tablet by mouth nightly for cholesterol  Other Orders: Tdap => 57yrs IM (57846) Admin 1st Vaccine (96295) Admin 1st Vaccine [facility]) [phone])  Diabetes Management Assessment/Plan:      His blood pressure goal is < 130/80.    Patient Instructions: 1)  Schedule lab visit for TSH in January. 2)  Continue to take your medications daily until then. 3)  Schedule office visit with n.martin,fnp in February 2011 for diabetes 4)  you will be notified of any abnormal lab results  Laboratory Results   Urine Tests  Date/Time Received: March 05, 2009 11:28 AM  Date/Time Reported: March 05, 2009 11:28 AM   Routine Urinalysis   Color: lt. yellow Appearance: Clear Glucose: negative   (Normal Range: Negative) Bilirubin: negative   (Normal Range: Negative) Ketone: negative   (Normal Range: Negative) Spec. Gravity: <1.005   (Normal Range: 1.003-1.035) Blood: negative   (Normal Range: Negative) pH: 7.0   (Normal Range: 5.0-8.0) Protein: negative   (Normal Range: Negative) Urobilinogen: 0.2   (Normal Range: 0-1) Nitrite: negative   (Normal Range: Negative) Leukocyte Esterace: negative   (Normal Range: Negative)     Blood Tests     CBG Random:: 38  Date/Time Received: March 06, 2009 4:48 PM   Stool - Occult Blood Hemmoccult #1: negative Date: 03/06/2009    Prevention & Chronic Care Immunizations   Influenza vaccine: Not documented   Influenza vaccine deferral: Refused  (03/05/2009)    Tetanus booster: 03/05/2009: Tdap    Pneumococcal vaccine: Not documented  Colorectal Screening   Hemoccult: Not documented    Colonoscopy: Not documented   Colonoscopy action/deferral: Refused  (03/05/2009)  Other  Screening   PSA: 0.84  (02/08/2009)   PSA action/deferral: Discussed-PSA requested  (03/05/2009)   PSA due due: 02/08/2010   Smoking status: never  (03/05/2009)  Diabetes Mellitus   HgbA1C: 8.2  (02/08/2009)   HgbA1C action/deferral: Ordered  (03/05/2009)   Hemoglobin A1C due: 05/11/2009    Eye exam: Not documented    Foot exam: Not documented   High risk foot: Not documented   Foot care education: Not documented    Urine microalbumin/creatinine ratio: Not documented    Diabetes flowsheet reviewed?: Yes  Lipids   Total Cholesterol: Not documented   Lipid panel action/deferral: Lipid Panel ordered   LDL: Not documented   LDL Direct: Not documented   HDL: Not documented   Triglycerides: Not documented    SGOT (AST): 20  (02/08/2009)   SGPT (ALT): 21  (02/08/2009)   Alkaline phosphatase: 95  (02/08/2009)   Total bilirubin: 1.0  (02/08/2009)    Lipid flowsheet reviewed?: N/A  Self-Management Support :    Diabetes self-management support: Not documented    Lipid self-management support: Not documented    Nursing Instructions: Give tetanus booster today CBG today (see order)    Tetanus/Td Vaccine    Vaccine Type: Tdap    Site: right deltoid    Mfr: Sanofi Pasteur    Dose: 0.5 ml    Route: IM    Given by: Levon Hedger    Exp. Date: 12/01/2010    Lot #: [id number]    VIS given: 02/22/07 version given March 05, 2009.    ndc  [id number]  Laboratory Results   Urine Tests    Routine Urinalysis   Color: lt. yellow Appearance: Clear Glucose: negative   (Normal Range: Negative) Bilirubin: negative   (Normal Range: Negative) Ketone: negative   (Normal Range: Negative) Spec. Gravity: <1.005   (Normal Range: 1.003-1.035) Blood: negative   (Normal Range: Negative) pH: 7.0   (Normal Range: 5.0-8.0) Protein: negative   (Normal Range: Negative) Urobilinogen: 0.2   (Normal Range: 0-1) Nitrite: negative   (Normal Range: Negative) Leukocyte Esterace:  negative   (Normal Range: Negative)     Blood Tests     CBG Random:: 38mg /dL

## 2010-05-08 NOTE — Progress Notes (Signed)
Summary: Office Visit/DEPRESSION SCREENING  Office Visit/DEPRESSION SCREENING   Imported By: Arta Bruce 04/26/2009 09:49:31  _____________________________________________________________________  External Attachment:    Type:   Image     Comment:   External Document

## 2010-05-08 NOTE — Assessment & Plan Note (Signed)
Summary: BLOOD RESSURE//[facility]  Nurse Visit   Vital Signs:  Patient profile:   60 year old male Pulse rate:   60 / minute Pulse rhythm:   regular Resp:     20 per minute BP sitting:   130 / 64  (right arm) Cuff size:   regular  Vitals Entered By: Dutch Quint RN (March 03, 2010 11:35 AM)  Impression & Recommendations:  Problem # 1:  HYPERTENSION, BENIGN ESSENTIAL (ICD-401.1) BP much improved  Continue lisinopril as ordered Call pharmacy to get refills timely To follow-up with provider as scheduled  His updated medication list for this problem includes:    Lisinopril 10 Mg Tabs (Lisinopril) ..... One tablet by mouth daily for blood pressure  Problem # 2:  DIABETES MELLITUS, TYPE II, ON INSULIN (ICD-250.00)  Did not bring CBG log as instructed  His updated medication list for this problem includes:    Novolog Mix 70/30 70-30 % Susp (Insulin aspart prot & aspart) .Marland Kitchen... 28 units subcutaneously before breakfast and  dinner (waiting for med from map)    Bayer Childrens Aspirin 81 Mg Chew (Aspirin) ..... One tablet by mouth daily    Lantus 100 Unit/ml Soln (Insulin glargine) .Marland Kitchen... 12 units subcutaneously nightly    Lisinopril 10 Mg Tabs (Lisinopril) ..... One tablet by mouth daily for blood pressure    Novolin R 100 Unit/ml Soln (Insulin regular human) .Marland KitchenMarland KitchenMarland KitchenMarland Kitchen 5 units subcutaneously two times a day    Novolin N 100 Unit/ml Susp (Insulin isophane human) .Marland KitchenMarland KitchenMarland KitchenMarland Kitchen 15 units in the am and 13 units in the pm  Complete Medication List: 1)  Novolog Mix 70/30 70-30 % Susp (Insulin aspart prot & aspart) .... 28 units subcutaneously before breakfast and  dinner (waiting for med from map) 2)  Blood Glucose Meter Kit (Blood glucose monitoring suppl) .... Dispense to check blood sugar twice daily 3)  Lancets Misc (Lancets) .... Use to check blood sugar twice daily 4)  Blood Glucose Test Strp (Glucose blood) .... Use to check blood sugar twice daily 5)  Levothyroxine Sodium 50 Mcg Tabs (Levothyroxine  sodium) .... One tablet by mouth daily for thyroid 6)  Pravastatin Sodium 10 Mg Tabs (Pravastatin sodium) .... One tablet by mouth nightly for cholesterol 7)  Bayer Childrens Aspirin 81 Mg Chew (Aspirin) .... One tablet by mouth daily 8)  Lantus 100 Unit/ml Soln (Insulin glargine) .Marland Kitchen.. 12 units subcutaneously nightly 9)  Lisinopril 10 Mg Tabs (Lisinopril) .... One tablet by mouth daily for blood pressure 10)  Novolin R 100 Unit/ml Soln (Insulin regular human) .... 5 units subcutaneously two times a day 11)  Novolin N 100 Unit/ml Susp (Insulin isophane human) .Marland Kitchen.. 15 units in the am and 13 units in the pm   Patient Instructions: 1)  Reviewed with Jesse Fall 2)  Your blood pressure is much better -- 130/64. 3)  Continue taking your medications as ordered. 4)  Make sure you contact your pharmacy so that you can get your refills on time. 5)  Keep your appointment with your provider in January. 6)  Call if anything changes or if you have any questions.   Review of Systems CV:  States asymptomatic.Marland Kitchen   CC:  BP recheck.  History of Present Illness: 02/14/10 BP 150/90.  Started lisinopril then, denies side effects, has been taking for two solid weeks, took meds at 9 am.  Did not bring his CBG log this morning as instructed.  CC: BP recheck Is Patient Diabetic? Yes Did you bring your  meter with you today? Yes Pain Assessment Patient in pain? no       Does patient need assistance? Functional Status Self care Ambulation Normal   Allergies: No Known Drug Allergies  Appended Document: BLOOD RESSURE//[facility]    Nurse Visit   Allergies: No Known Drug Allergies  Orders Added: 1)  Est. Patient Level I [24401]

## 2010-05-08 NOTE — Assessment & Plan Note (Signed)
Summary: Diabetes   Vital Signs:  Patient profile:   60 year old male Weight:      198.0 pounds BMI:     27.14 Temp:     97.8 degrees F oral Pulse rate:   62 / minute Pulse rhythm:   regular Resp:     16 per minute BP sitting:   147 / 81  (left arm) Cuff size:   large  Vitals Entered By: Isla Pence (September 26, 2009 3:47 PM) Is Patient Diabetic? Yes Pain Assessment Patient in pain? no      CBG Result 394 CBG Device ID B  Does patient need assistance? Functional Status Self care Ambulation Normal   History of Present Illness:  Pt into the office for follow up for diabetes. pt was into the office 2 weeks ago for diabetes Pt presents today with his blood sugar log. He did not check three times a day as ordered But he does bring a blood sugar log and when he does check in the morning it is consecutively over 300. Pt is taking novolog 70/30  - 28 units two times a day   Sister present today with pt  Allergies (verified): No Known Drug Allergies  Review of Systems General:  Denies fever. CV:  Denies chest pain or discomfort. Resp:  Denies cough. GI:  Denies abdominal pain, nausea, and vomiting.  Physical Exam  General:  alert.   Head:  normocephalic.   Lungs:  normal breath sounds.   Heart:  normal rate and regular rhythm.   Msk:  normal ROM.   Neurologic:  alert & oriented X3.     Impression & Recommendations:  Problem # 1:  DIABETES MELLITUS, TYPE II, ON INSULIN (ICD-250.00) reviewed with pt diabetes pathology will start lantus 10 units nightly His updated medication list for this problem includes:    Novolin 70/30 70-30 % Susp (Insulin isophane & regular) .Marland Kitchen... 28 units subcutaneously in the morning and 28 units subcutaneously at night for diabetes    Bayer Childrens Aspirin 81 Mg Chew (Aspirin) ..... One tablet by mouth daily    Lantus 100 Unit/ml Soln (Insulin glargine) .Marland KitchenMarland KitchenMarland KitchenMarland Kitchen 10 units subcutaneously nightly  Orders: Capillary Blood Glucose/CBG  [id number])  Problem # 2:  HYPERCHOLESTEROLEMIA (ICD-272.0)  His updated medication list for this problem includes:    Pravastatin Sodium 10 Mg Tabs (Pravastatin sodium) ..... One tablet by mouth nightly for cholesterol  Complete Medication List: 1)  Novolin 70/30 70-30 % Susp (Insulin isophane & regular) .... 28 units subcutaneously in the morning and 28 units subcutaneously at night for diabetes 2)  Blood Glucose Meter Kit (Blood glucose monitoring suppl) .... Dispense to check blood sugar twice daily 3)  Lancets Misc (Lancets) .... Use to check blood sugar twice daily 4)  Blood Glucose Test Strp (Glucose blood) .... Use to check blood sugar twice daily 5)  Levothyroxine Sodium 50 Mcg Tabs (Levothyroxine sodium) .... One tablet by mouth daily for thyroid 6)  Pravastatin Sodium 10 Mg Tabs (Pravastatin sodium) .... One tablet by mouth nightly for cholesterol 7)  Bayer Childrens Aspirin 81 Mg Chew (Aspirin) .... One tablet by mouth daily 8)  Lantus 100 Unit/ml Soln (Insulin glargine) .Marland Kitchen.. 10 units subcutaneously nightly  Patient Instructions: 1)  You will need to add lantus - 10 units at night 2)  Continue 70/30 - 28 unites nightly 3)  Follow up in 2 weeks for diabetes 4)  Bring blood sugar log 5)  May need insulin titration of lantus

## 2010-05-08 NOTE — Progress Notes (Signed)
Summary: needs new rx  Phone Note Call from Patient   Summary of Call: Rolinda Roan called on pt and she got the script for glocometer, but she needed it to say meter and supplies or othewise she can not get strips or lancets. It just needs to say "Dexcom 7+ continus glocose monitoring system and supplies"  She can be reached at [phone]. Initial call taken by: Vesta Mixer CMA,  December 04, 2009 12:17 PM  Follow-up for Phone Call        call number and see if you can give a verbal to include the strips and supplies for that continuous monitor. I already wrote for the actual monitoring machine and they have the strips 1 month supply for each with 5 refils if can be done over the phone Follow-up by: Lehman Prom FNP,  December 04, 2009 12:23 PM  Additional Follow-up for Phone Call Additional follow up Details #1::        Left message on answering machine for pt. to return call.  Dutch Quint RN  December 04, 2009 2:42 PM     Additional Follow-up for Phone Call Additional follow up Details #2::    Pam states that Rx must read "Dexcom 7+ continus glocose monitoring system and supplies -- also, needs to have Rx that states refills for sensors (they're not called strips, they're called "sensors".  She'll pick up Rx when ready.  Dutch Quint RN  December 04, 2009 3:52 PM   Additional Follow-up for Phone Call Additional follow up Details #3:: Details for Additional Follow-up Action Taken: Rx written and in basket  Left message on answering machine for pt. to return call.  Dutch Quint RN  December 04, 2009 4:38 PM  Pt. sister advised that Rx is ready for pick-up.  Dutch Quint RN  December 05, 2009 8:14 AM  Additional Follow-up by: Lehman Prom FNP,  December 04, 2009 4:27 PM

## 2010-05-08 NOTE — Assessment & Plan Note (Signed)
Summary: Diabetes   Vital Signs:  Patient profile:   60 year old male Weight:      190.2 pounds BMI:     26.07 BSA:     2.08 Temp:     97.9 degrees F oral Pulse rate:   80 / minute Pulse rhythm:   regular Resp:     20 per minute BP sitting:   106 / 60  (left arm) Cuff size:   regular  Vitals Entered By: Levon Hedger (April 25, 2009 11:29 AM) CC: blood sugar dropping, Lipid Management Is Patient Diabetic? Yes Pain Assessment Patient in pain? no      CBG Result 196 CBG Device ID B  Does patient need assistance? Functional Status Self care Ambulation Normal   CC:  blood sugar dropping and Lipid Management.  History of Present Illness:  Pt into the office for follow up on blood sugar. Sister called this office with several concerns about the pt's blood sugar  He has been keeping a blood sugar log: Yesterday  Morning 5:30 = 227 go exercise insulin - 32 units of 70/30; ate 2 boiled eggs recheck at lunch = 337 recheck at 2:00 - 126 rechecked at 5:30 = 163 Ate dinner but did not recheck  Morning 5:30 = 131 go exercise, ate applesause, Jelly insulin - 32 units of 70/30 recheck in office at 196  Exercise - pt is going to the gym times per week.     Diabetes Management History:      The patient is a 60 years old male who comes in for evaluation of DM Type 2.  He has not been enrolled in the "Diabetic Education Program".  He states understanding of dietary principles and is following his diet appropriately.  No sensory loss is reported.  Self foot exams are not being performed.  He is checking home blood sugars.  He says that he is not exercising regularly.        Reported hypoglycemic symptoms include weakness.  Frequency of hypoglycemic symptoms are reported to be occasionally.    Lipid Management History:      Positive NCEP/ATP III risk factors include male age 12 years old or older and diabetes.  Negative NCEP/ATP III risk factors include HDL cholesterol  greater than 60, non-tobacco-user status, non-hypertensive, no ASHD (atherosclerotic heart disease), no prior stroke/TIA, no peripheral vascular disease, and no history of aortic aneurysm.        The patient states that he does not know about the "Therapeutic Lifestyle Change" diet.  The patient does not know about adjunctive measures for cholesterol lowering.     Habits & Providers  Alcohol-Tobacco-Diet     Alcohol drinks/day: 0     Tobacco Status: never  Exercise-Depression-Behavior     Does Patient Exercise: yes     Exercise Counseling: not indicated; exercise is adequate     Depression Counseling: going to a couselor     Drug Use: never  Comments: Started going to gym about 2 weeks ago at [employer]  Allergies (verified): No Known Drug Allergies  Social History: Does Patient Exercise:  yes  Review of Systems CV:  Denies chest pain or discomfort. Resp:  Denies cough. GI:  Denies abdominal pain, nausea, and vomiting.  Physical Exam  General:  alert.   Head:  male-pattern balding.   Lungs:  normal breath sounds.   Heart:  normal rate and regular rhythm.   Abdomen:  normal bowel sounds.   Msk:  normal ROM.  Neurologic:  alert & oriented X3.   Skin:  color normal.   Psych:  Oriented X3.    Diabetes Management Exam:    Foot Exam (with socks and/or shoes not present):       Sensory-Monofilament:          Left foot: normal          Right foot: normal   Impression & Recommendations:  Problem # 1:  DIABETES MELLITUS, TYPE II, ON INSULIN (ICD-250.00) most likely due to increase in exercise will decrease insulin to 30 units and 28 units if still low during the day His updated medication list for this problem includes:    Novolin 70/30 70-30 % Susp (Insulin isophane & regular) .Marland Kitchen... 32 units subcutaneously in the morning and 32 units subcutaneously at night for diabetes  Orders: Capillary Blood Glucose/CBG (16109)  Problem # 2:  HYPOTHYROIDISM (ICD-244.9) will  recheck on next visit His updated medication list for this problem includes:    Levothyroxine Sodium 50 Mcg Tabs (Levothyroxine sodium) ..... One tablet by mouth daily for thyroid **pharmacy - note change in dose**  Problem # 3:  HYPERCHOLESTEROLEMIA (ICD-272.0) stable. His updated medication list for this problem includes:    Pravastatin Sodium 10 Mg Tabs (Pravastatin sodium) ..... One tablet by mouth nightly for cholesterol  Problem # 4:  NEED PROPHYLACTIC VACCINATION&INOCULATION FLU (ICD-V04.81) indication: diabetes  Complete Medication List: 1)  Novolin 70/30 70-30 % Susp (Insulin isophane & regular) .... 32 units subcutaneously in the morning and 32 units subcutaneously at night for diabetes 2)  Blood Glucose Meter Kit (Blood glucose monitoring suppl) .... Dispense to check blood sugar twice daily 3)  Lancets Misc (Lancets) .... Use to check blood sugar twice daily 4)  Blood Glucose Test Strp (Glucose blood) .... Use to check blood sugar twice daily 5)  Levothyroxine Sodium 50 Mcg Tabs (Levothyroxine sodium) .... One tablet by mouth daily for thyroid **pharmacy - note change in dose** 6)  Pravastatin Sodium 10 Mg Tabs (Pravastatin sodium) .... One tablet by mouth nightly for cholesterol  Other Orders: Flu Vaccine 55yrs + [phone]) Admin 1st Vaccine (09811) Admin 1st Vaccine [facility]) [phone])  Diabetes Management Assessment/Plan:      The following lipid goals have been established for the patient: Total cholesterol goal of 200; LDL cholesterol goal of 100; HDL cholesterol goal of 40; Triglyceride goal of 150.  His blood pressure goal is < 130/80.    Lipid Assessment/Plan:      Based on NCEP/ATP III, the patient's risk factor category is "history of diabetes".  The patient's lipid goals are as follows: Total cholesterol goal is 200; LDL cholesterol goal is 100; HDL cholesterol goal is 40; Triglyceride goal is 150.    Patient Instructions: 1)  Keep your next appointment in  February. 2)  Keep checking your blood sugar twice daily before breakfast and then between 2-3pm daily. 3)  Titrate down to 30 units two times a day and may go to 28 units two times a day if still with hypoglycemia  Last LDL:                                                 120 (03/05/2009 9:21:00 PM)          Diabetic Foot Exam    10-g (5.07) Semmes-Weinstein Monofilament Test Performed by: Steward Drone  [place]          Right Foot          Left Foot Visual Inspection               Test Control      normal         normal Site 1         normal         normal Site 2         normal         normal Site 3         normal         normal Site 4         normal         normal Site 5         normal         normal Site 6         normal         normal Site 7         normal         normal Site 8         normal         normal Site 9         normal         normal Site 10         normal         normal  Impression      normal         normal    Influenza Vaccine    Vaccine Type: Fluvax 3+    Site: right deltoid    Mfr: Sanofi Pasteur    Dose: 0.5 ml    Route: IM    Given by: Levon Hedger    Exp. Date: 10/03/2009    Lot #: [id number]    VIS given: 10/28/06 version given April 25, 2009.  Flu Vaccine Consent Questions    Do you have a history of severe allergic reactions to this vaccine? no    Any prior history of allergic reactions to egg and/or gelatin? no    Do you have a sensitivity to the preservative Thimersol? no    Do you have a past history of Guillan-Barre Syndrome? no    Do you currently have an acute febrile illness? no    Have you ever had a severe reaction to latex? no    Vaccine information given and explained to patient? yes

## 2010-05-08 NOTE — Progress Notes (Signed)
Summary: Hypoglycemia  Phone Note Call from Patient   Summary of Call: Pt's sister called and is worried that he is not eating enough for how much insulin he is taking.  She went to see him today and he was in a stupor.  His blood sugar was 33.  This is at least the second time this has happened that she knows of.  Pt has appt on Thursday 1/20 at 11:15.  Anything different you want him to do in the mean time? Initial call taken by: Vesta Mixer CMA,  April 22, 2009 3:49 PM  Follow-up for Phone Call        Advise pt to bring blood sugar log with him to appt on 04/25/2009 He may need insulin adjustment but it also depends on pt's eating habits.  If he is skipping meals and taking insulin then he is going to be hypoglycemic.  He should be eating three small meals per day. Follow-up by: Lehman Prom FNP,  April 22, 2009 4:00 PM  Additional Follow-up for Phone Call Additional follow up Details #1::        Levon Hedger  April 23, 2009 4:47 PM Left message on machine for pt to return call to the office.    Additional Follow-up for Phone Call Additional follow up Details #2::    Spoke with pt's sister Bertell Maria  she states that he lives alone and she know that he is not eating correctly his idea of a meal is a piece of fruit and some drink.  She says that he is very out of control.  He had another low reading episode of less that 30 and he gets out of control. He gets disoriented and very hostile.  She is very concerned that if he is not told how many times he needs to check his sugars and that he needs to eat a meal she is afraid he is going to kill himself.  She says he has issues about the price of strips and he does good to check his sugar 1 time a day and once he gets past his low sugar episode then his sugar readings are very high at the end of the day.  She says that he may need to be lowered on his dosage of insulin.  She says that she will be able to come with pt to his  appointment on Thursday.  She does not think he will keep a diary because he barely wants to check his sugar.  I told her that I would get this to his provider. Follow-up by: Levon Hedger,  April 24, 2009 9:59 AM  Additional Follow-up for Phone Call Additional follow up Details #3:: Details for Additional Follow-up Action Taken: will discuss with pt at f/u on tomorrow Pt needs the insulin but he also needs to eat properly. so it will be very difficult for me to adjust insulin based on hypoglycemia which happens because pt is not checking blood sugar and he is not eating properly. Not eating will not improve his blood sugar.  it will only make him have very sporatic sugars Additional Follow-up by: Lehman Prom FNP,  April 24, 2009 11:22 AM

## 2010-05-08 NOTE — Assessment & Plan Note (Signed)
Summary: Diabetes   Vital Signs:  Patient profile:   60 year old male Weight:      205.2 pounds BMI:     28.13 Temp:     97.2 degrees F oral Pulse rate:   72 / minute Pulse rhythm:   regular Resp:     16 per minute BP sitting:   144 / 78  (left arm) Cuff size:   regular  Vitals Entered By: Levon Hedger (January 13, 2010 2:11 PM)  Nutrition Counseling: Patient's BMI is greater than 25 and therefore counseled on weight management options. CC: follow-up visit 2 month DM, Lipid Management Is Patient Diabetic? Yes CBG Result 118 CBG Device ID B  Does patient need assistance? Functional Status Self care Ambulation Normal   CC:  follow-up visit 2 month DM and Lipid Management.  History of Present Illness:  Pt into the office for diabetes. There has been frequent conversations with the pharmacist and pt and sister since last visit here. Pt continues to purchase medication from [facility] and actually presents today with the medication   Pt presents today with his sister, who is assising with pt care.  Diabetes Management History:      The patient is a 60 years old male who comes in for evaluation of Type 2 Diabetes Mellitus.  He has not been enrolled in the "Diabetic Education Program".  He states understanding of dietary principles and is following his diet appropriately.  No sensory loss is reported.  Self foot exams are not being performed.  He is checking home blood sugars.  He says that he is exercising.  Type of exercise includes: biking.  Duration of exercise is estimated to be 30 min.  He is doing this 3 times per week.    Lipid Management History:      Positive NCEP/ATP III risk factors include male age 8 years old or older and diabetes.  Negative NCEP/ATP III risk factors include HDL cholesterol greater than 60, non-tobacco-user status, non-hypertensive, no ASHD (atherosclerotic heart disease), no prior stroke/TIA, no peripheral vascular disease, and no history of aortic  aneurysm.        The patient states that he does not know about the "Therapeutic Lifestyle Change" diet.  The patient does not know about adjunctive measures for cholesterol lowering.  Adjunctive measures started by the patient include ASA.  Comments include: pt has not been taking cholesterol medications.      Allergies (verified): No Known Drug Allergies  Review of Systems General:  Denies fever. CV:  Denies chest pain or discomfort. Resp:  Denies cough. GI:  Denies abdominal pain, nausea, and vomiting.  Physical Exam  General:  alert.   Head:  normocephalic.   Lungs:  normal breath sounds.   Heart:  normal rate and regular rhythm.   Abdomen:  normal bowel sounds.   Msk:  up to the exam table Neurologic:  alert & oriented X3.   Skin:  color normal.   Psych:  Oriented X3.    Diabetes Management Exam:       Nails:          Left foot: normal          Right foot: normal   Impression & Recommendations:  Problem # 1:  DIABETES MELLITUS, TYPE II, ON INSULIN (ICD-250.00) Pt is using meter to check blood sugar hourly Counseled regarding medication use  His updated medication list for this problem includes:    Novolog Mix 70/30 70-30 % Susp (Insulin  aspart prot & aspart) .Marland Kitchen... 28 units subcutaneously before breakfast and dinner    Bayer Childrens Aspirin 81 Mg Chew (Aspirin) ..... One tablet by mouth daily    Lantus 100 Unit/ml Soln (Insulin glargine) .Marland Kitchen... 12 units subcutaneously nightly  Orders: Capillary Blood Glucose/CBG (82948) T- Hemoglobin A1C (14782-95621)  Problem # 2:  ELEVATED BLOOD PRESSURE WITHOUT DIAGNOSIS OF HYPERTENSION (ICD-796.2) BP is elevated. will still monitor. If elevated then will need meds  Problem # 3:  HYPOTHYROIDISM (ICD-244.9) will check labs on next visit His updated medication list for this problem includes:    Levothyroxine Sodium 50 Mcg Tabs (Levothyroxine sodium) ..... One tablet by mouth daily for thyroid  Problem # 4:   HYPERCHOLESTEROLEMIA (ICD-272.0) will need to check on next visit pt is not taking medications His updated medication list for this problem includes:    Pravastatin Sodium 10 Mg Tabs (Pravastatin sodium) ..... One tablet by mouth nightly for cholesterol  Problem # 5:  NEED PROPHYLACTIC VACCINATION&INOCULATION FLU (ICD-V04.81) flu vaccine given today  Complete Medication List: 1)  Novolog Mix 70/30 70-30 % Susp (Insulin aspart prot & aspart) .... 28 units subcutaneously before breakfast and dinner 2)  Blood Glucose Meter Kit (Blood glucose monitoring suppl) .... Dispense to check blood sugar twice daily 3)  Lancets Misc (Lancets) .... Use to check blood sugar twice daily 4)  Blood Glucose Test Strp (Glucose blood) .... Use to check blood sugar twice daily 5)  Levothyroxine Sodium 50 Mcg Tabs (Levothyroxine sodium) .... One tablet by mouth daily for thyroid 6)  Pravastatin Sodium 10 Mg Tabs (Pravastatin sodium) .... One tablet by mouth nightly for cholesterol 7)  Bayer Childrens Aspirin 81 Mg Chew (Aspirin) .... One tablet by mouth daily 8)  Lantus 100 Unit/ml Soln (Insulin glargine) .Marland Kitchen.. 12 units subcutaneously nightly  Other Orders: Flu Vaccine 23yrs + [phone]) Admin 1st Vaccine (78469) Admin 1st Vaccine [facility]) [phone])  Diabetes Management Assessment/Plan:      The following lipid goals have been established for the patient: Total cholesterol goal of 200; LDL cholesterol goal of 100; HDL cholesterol goal of 40; Triglyceride goal of 150.  His blood pressure goal is < 130/80.    Lipid Assessment/Plan:      Based on NCEP/ATP III, the patient's risk factor category is "history of diabetes".  The patient's lipid goals are as follows: Total cholesterol goal is 200; LDL cholesterol goal is 100; HDL cholesterol goal is 40; Triglyceride goal is 150.  His LDL cholesterol goal has not been met.    Patient Instructions: 1)  Morning - Humulin R 5 units and Humulin N 20 units 2)  Evening - Humulin  R 5 units and Humulin N 15 untis 3)  Lantus 12units subcutaneously nightly 4)  Blood pressure - still slightly elevated.  Will need to monitor.   5)  Schedule visit in 1 month with n.martin, fnp for diabetes. 6)  no food after midnight before this visit. 7)  Will need lipids, tsh, hgba1c, u/a, cbg  Diabetic Foot Exam Foot Inspection Is there a history of a foot ulcer?              No Is there a foot ulcer now?              No Can the patient see the bottom of their feet?          Yes Are the shoes appropriate in style and fit?          Yes  Is there swelling or an abnormal foot shape?          No Are the toenails long?                No Are the toenails thick?                No Are the toenails ingrown?              No Is there heavy callous build-up?              No Is there pain in the calf muscle (Intermittent claudication) when walking?    NoIs there a claw toe deformity?              No Is there elevated skin temperature?            No Is there limited ankle dorsiflexion?            No Is there foot or ankle muscle weakness?            No  Diabetic Foot Care Education Pulse Check          Right Foot          Left Foot Dorsalis Pedis:        normal            normal   Laboratory Results   Blood Tests     CBG Random:: 118       Influenza Vaccine    Vaccine Type: Fluvax 3+    Site: left deltoid    Mfr: GlaxoSmithKline    Dose: 0.5 ml    Route: IM    Given by: Levon Hedger    Exp. Date: 09/2010    Lot #: [id number]    VIS given: 10/29/09 version given January 13, 2010.  Flu Vaccine Consent Questions    Do you have a history of severe allergic reactions to this vaccine? no    Any prior history of allergic reactions to egg and/or gelatin? no    Do you have a sensitivity to the preservative Thimersol? no    Do you have a past history of Guillan-Barre Syndrome? no    Do you currently have an acute febrile illness? no    Have you ever had a severe reaction to  latex? no    Vaccine information given and explained to patient? yes    ndc  [phone]

## 2010-05-08 NOTE — Progress Notes (Signed)
Summary: Cory [name] DROPPING  Phone Note Call from Patient Call back at [phone] OR [phone]   Caller: SISTER- PAM Reason for Call: Talk to Nurse Summary of Call: Cory [name]. PAM, SISTER CALLED AND SAYS THAT THEY HAVE BEEN MONITORING HIS BLOOD SUGARS, AND THEY STAY SOMETME AROUND 60. SHE SAYS THAT Cory [name] THINKS THAT NO MATTER WHAT, IF HIS READING IS 60 HE IS SUPOSE TO TAKE THE REGULAR INSULIN AND SHE DOESN;T THINK HE IS. PAM SAYS THAT Cory [name] PROBABLY FEELS LIKE YOU WILL GET MAD AT HIM IF HE DOES'T DO EXACTLY WHAT YOU SAY FOR HIM TO DO. PAM SAYS THAT AFTER HE TOOK THE DOGS FOR A WOLK YESTERDAY, THEY CHECKED HIS LEVEL AND IT READ 48. PAM ASK THE QUESTION WOULD YOU WANT HIM TO ADJUST THE NPH? Initial call taken by: Leodis Rains,  December 18, 2009 12:00 PM  Follow-up for Phone Call        Good question If his blood sugar is below 80 as in the case after exercise. then [name] should take 1/2 of his NPH dose due at that time and eat his meal he should take lantus as ordered nightly as this is long acting insulin both have been faxed to the health department as it may be less expensive for [name] to get from there  Additional Follow-up for Phone Call Additional follow up Details #1::        Blood sugars are low when he gets up in the morning.   This morning it was 60, yesterday it was 40.  Is still taking his regular insulin in the morning, no matter what his CBGs are.  All of his CBGs are below 80.  States that his sugars are "plummeting" in the afternoon.  They are checking his CBGs three times a day.  He's trying to do exactly what he's told.  Sister says she needs the exact regimen for him to follow.  She thinks he needs a sliding scale.  She says if you tell her exactly what he needs to do, she will make sure he follows it.  Dutch Quint RN  December 18, 2009 12:54 PM     Additional Follow-up for Phone Call Additional follow up Details #2::    still proceed with above  advise if blood sugars are below 80 -take only 1/2 the dose take lantus at night as scheduled Follow-up by: Lehman Prom FNP,  December 18, 2009 1:12 PM  Additional Follow-up for Phone Call Additional follow up Details #3:: Details for Additional Follow-up Action Taken: Discussed issues with low CBGs and insulins at length.  Sister is going to try to take away his NPH and regular insulin and get him to keep with the70/30 regimen only.  Advised that his CBGs will probably stop bottoming out if he keeps to what is ordered.  If he continues to bottom out on the 70/30, she is going to call back for further instructions.  Dutch Quint RN  December 18, 2009 3:12 PM

## 2010-05-09 ENCOUNTER — Encounter (INDEPENDENT_AMBULATORY_CARE_PROVIDER_SITE_OTHER): Payer: Self-pay | Admitting: Nurse Practitioner

## 2010-05-14 NOTE — Letter (Signed)
Summary: *HSN Results Follow up  [facility]  [address] [place], [place] [address]   Phone: [phone]  Fax: [phone]      05/09/2010   ARIC JOST [id number] [place], [place]  [address]   Dear  Mr. TREMAINE EARWOOD,                            ____S.Drinkard,FNP   ____D. Gore,FNP       ____B. McPherson,MD   ____V. Rankins,MD    ____E. Mulberry,MD    __X__N. Daphine Deutscher, FNP  ____D. Reche Dixon, MD    ____K. Philipp Deputy, MD    ____Other     This letter is to inform you that your recent test(s):  _______Pap Smear    ____X___Lab Test     _______X-ray    ___X____ is within acceptable limits  _______ requires a medication change  _______ requires a follow-up lab visit  _______ requires a follow-up visit with your provider   Comments:  Labs done during recent office were ok except your blood sugar is still slightly elevated. Continue with plan as outlined during your recent office visit regarding your insulin.     _________________________________________________________ If you have any questions, please contact our office [phone].                    Sincerely,    Lehman Prom FNP [facility]

## 2010-05-14 NOTE — Assessment & Plan Note (Signed)
Summary: Diabetes/HTN   Vital Signs:  Patient profile:   60 year old male Weight:      202.5 pounds Temp:     97.4 degrees F oral Pulse rate:   88 / minute Pulse rhythm:   regular Resp:     20 per minute BP sitting:   146 / 70  (left arm) Cuff size:   regular  Vitals Entered By: Levon Hedger (May 08, 2010 2:20 PM) CC: follow-up visit DM, Hypertension Management, Lipid Management Is Patient Diabetic? Yes Pain Assessment Patient in pain? no      CBG Result 110 CBG Device ID A  Does patient need assistance? Functional Status Self care Ambulation Normal   CC:  follow-up visit DM, Hypertension Management, and Lipid Management.  History of Present Illness:  Pt into the office for f/u on diabetes  Diabetes - pt has changed insulin yet again since his last visit. He finally got the Insuin from [facility] and pt has been taking novolog mix 23 units two times a day in addition to the lantus 10 units two times a day  Pt still admits to some flucations in blood sugar he wears a monitor that tracks his blood sugar hourly.  Obesity - down 8 pounds since the last visit.  Pt presents today with all his medications  Diabetes Management History:      The patient is a 60 years old male who comes in for evaluation of Type 2 Diabetes Mellitus.  He has not been enrolled in the "Diabetic Education Program".  He states understanding of dietary principles but he is not following the appropriate diet.  No sensory loss is reported.  Self foot exams are not being performed.  He is checking home blood sugars.  He says that he is exercising.  Type of exercise includes: biking.  Duration of exercise is estimated to be 30 min.  He is doing this 3 times per week.        Hypoglycemic symptoms are not occurring.  No hyperglycemic symptoms are reported.        The following changes have been made to his treatment plan since last visit: insulin dosing.  Treatment plan changes were initiated by patient.      Hypertension History:      BP is up today. He presents today with lisinopril but he only took 3 weeks worth due to fatigue.  he stopped taking the lisinopril all together.        Positive major cardiovascular risk factors include male age 30 years old or older, diabetes, hyperlipidemia, and hypertension.  Negative major cardiovascular risk factors include non-tobacco-user status.        Further assessment for target organ damage reveals no history of ASHD, cardiac end-organ damage (CHF/LVH), stroke/TIA, peripheral vascular disease, renal insufficiency, or hypertensive retinopathy.    Lipid Management History:      Positive NCEP/ATP III risk factors include male age 23 years old or older, diabetes, and hypertension.  Negative NCEP/ATP III risk factors include non-tobacco-user status, no ASHD (atherosclerotic heart disease), no prior stroke/TIA, no peripheral vascular disease, and no history of aortic aneurysm.        The patient states that he does not know about the "Therapeutic Lifestyle Change" diet.  The patient does not know about adjunctive measures for cholesterol lowering.  Adjunctive measures started by the patient include weight reduction.  He expresses no side effects from his lipid-lowering medication.  Comments include: Pt never started cholesterol meds  but last check was improved from before so he was instructed just to modify diet.  The patient denies any symptoms to suggest myopathy or liver disease.      Allergies (verified): No Known Drug Allergies  Review of Systems General:  Complains of fatigue; noted more when he was taking the lisinopril. CV:  Denies chest pain or discomfort. Resp:  Denies cough. GI:  Denies abdominal pain, nausea, and vomiting. Endo:  Complains of excessive thirst and excessive urination.  Physical Exam  General:  alert.   Head:  normocephalic.   Lungs:  normal breath sounds.   Heart:  normal rate and regular rhythm.   Abdomen:  normal bowel  sounds.   Msk:  up to the exam table Neurologic:  alert & oriented X3.   Skin:  color normal.   Psych:  Oriented X3.    Diabetes Management Exam:       Nails:          Left foot: normal          Right foot: normal  Diabetic Foot Exam Foot Inspection Is there a history of a foot ulcer?              No Is there a foot ulcer now?              No Can the patient see the bottom of their feet?          Yes Are the shoes appropriate in style and fit?          Yes Is there swelling or an abnormal foot shape?          No Are the toenails long?                No Are the toenails thick?                No Are the toenails ingrown?              No Is there heavy callous build-up?              No Is there pain in the calf muscle (Intermittent claudication) when walking?    NoIs there a claw toe deformity?              No Is there elevated skin temperature?            No Is there limited ankle dorsiflexion?            No Is there foot or ankle muscle weakness?            No  Diabetic Foot Care Education Patient educated on appropriate care of diabetic feet.  Pulse Check          Right Foot          Left Foot Dorsalis Pedis:        normal            normal    Impression & Recommendations:  Problem # 1:  DIABETES MELLITUS, TYPE II, ON INSULIN (ICD-250.00) still spoke with pt about NOT self adjusting his insulin Pt is afraid of hypoglycemic episodes which is understandable but also try to advise pt that changing insuiin makes his blood sugars go up and down as well The following medications were removed from the medication list:    Novolin R 100 Unit/ml Soln (Insulin regular human) .Marland KitchenMarland KitchenMarland KitchenMarland Kitchen 5 units subcutaneously two times a day    Novolin N 100  Unit/ml Susp (Insulin isophane human) .Marland KitchenMarland KitchenMarland KitchenMarland Kitchen 15 units in the am and 13 units in the pm His updated medication list for this problem includes:    Novolog Mix 70/30 70-30 % Susp (Insulin aspart prot & aspart) .Marland Kitchen... 25  units subcutaneously before breakfast  and  dinner (waiting for med from [facility])    Bayer Childrens Aspirin 81 Mg Chew (Aspirin) ..... One tablet by mouth daily    Lantus 100 Unit/ml Soln (Insulin glargine) .Marland Kitchen... 12 units subcutaneously nightly    Lisinopril 10 Mg Tabs (Lisinopril) ..... One tablet by mouth daily for blood pressure  Orders: Capillary Blood Glucose/CBG (82948) Hemoglobin A1C (83036) UA Dipstick w/o Micro (manual) ([address]) T-Urine Microalbumin w/creat. ratio [phone])  Problem # 2:  HYPERTENSION, BENIGN ESSENTIAL (ICD-401.1) Pt stopped taking med advised pt to take it at night since he seems to think that is what caused fatigue His updated medication list for this problem includes:    Lisinopril 10 Mg Tabs (Lisinopril) ..... One tablet by mouth daily for blood pressure  Orders: T-Comprehensive Metabolic Panel [phone]) T-PSA (65784-69629) T-CBC w/Diff (52841-32440) T-TSH [phone])  Problem # 3:  HYPERCHOLESTEROLEMIA (ICD-272.0) pt never started cholesterol meds as previously ordered LDL not at goal but pt wants to work on with diet His updated medication list for this problem includes:    Pravastatin Sodium 10 Mg Tabs (Pravastatin sodium) ..... Hold  Problem # 4:  HYPOTHYROIDISM (ICD-244.9) advised pt to keep taking meds will check labs today His updated medication list for this problem includes:    Levothyroxine Sodium 50 Mcg Tabs (Levothyroxine sodium) ..... One tablet by mouth daily for thyroid  Complete Medication List: 1)  Novolog Mix 70/30 70-30 % Susp (Insulin aspart prot & aspart) .... 25  units subcutaneously before breakfast and  dinner (waiting for med from map) 2)  Blood Glucose Meter Kit (Blood glucose monitoring suppl) .... Dispense to check blood sugar twice daily 3)  Lancets Misc (Lancets) .... Use to check blood sugar twice daily 4)  Blood Glucose Test Strp (Glucose blood) .... Use to check blood sugar twice daily 5)  Levothyroxine Sodium 50 Mcg Tabs (Levothyroxine  sodium) .... One tablet by mouth daily for thyroid 6)  Pravastatin Sodium 10 Mg Tabs (Pravastatin sodium) .... Hold 7)  Bayer Childrens Aspirin 81 Mg Chew (Aspirin) .... One tablet by mouth daily 8)  Lantus 100 Unit/ml Soln (Insulin glargine) .Marland Kitchen.. 12 units subcutaneously nightly 9)  Lisinopril 10 Mg Tabs (Lisinopril) .... One tablet by mouth daily for blood pressure  Diabetes Management Assessment/Plan:      The following lipid goals have been established for the patient: Total cholesterol goal of 200; LDL cholesterol goal of 100; HDL cholesterol goal of 40; Triglyceride goal of 150.  His blood pressure goal is < 130/80.    Hypertension Assessment/Plan:      The patient's hypertensive risk group is category C: Target organ damage and/or diabetes.  His calculated 10 year risk of coronary heart disease is 22 %.  Today's blood pressure is 146/70.  His blood pressure goal is < 130/80.  Lipid Assessment/Plan:      Based on NCEP/ATP III, the patient's risk factor category is "history of diabetes".  The patient's lipid goals are as follows: Total cholesterol goal is 200; LDL cholesterol goal is 100; HDL cholesterol goal is 40; Triglyceride goal is 150.  His LDL cholesterol goal has not been met.    Patient Instructions: 1)  Blood pressure - high today. 2)  You  should restart your lisinopril 10mg  by mouth nightly since it makes you sleepy. 3)  Diabetes - Novolog 70/30 - take 25 units subcutaneously two times a day  4)  Also take lantus 12 units subcutaneously nightly. 5)  Try to take a standing dose of insulin.  Do not fluctuate the amount of insulin. 6)  Your Hgba1c = 7.2 7)  Follow up in 3 months for diabetes.  Bring your blood sugar log into the office.   Orders Added: 1)  Capillary Blood Glucose/CBG [82948] 2)  Est. Patient Level IV [04540] 3)  T-Comprehensive Metabolic Panel [80053-22900] 4)  T-PSA [98119-14782] 5)  T-CBC w/Diff [95621-30865] 6)  T-TSH [78469-62952] 7)  Hemoglobin A1C  [83036] 8)  UA Dipstick w/o Micro (manual) [81002] 9)  T-Urine Microalbumin w/creat. ratio [[id number]]    Laboratory Results   Urine Tests  Date/Time Received: May 08, 2010 3:06 PM   Routine Urinalysis   Color: lt. yellow Glucose: negative   (Normal Range: Negative) Bilirubin: negative   (Normal Range: Negative) Ketone: negative   (Normal Range: Negative) Spec. Gravity: 1.015   (Normal Range: 1.003-1.035) Blood: negative   (Normal Range: Negative) pH: 5.5   (Normal Range: 5.0-8.0) Protein: negative   (Normal Range: Negative) Urobilinogen: 0.2   (Normal Range: 0-1) Nitrite: negative   (Normal Range: Negative) Leukocyte Esterace: negative   (Normal Range: Negative)     Blood Tests   Date/Time Received: May 08, 2010 3:06 PM   HGBA1C: 7.2%   (Normal Range: Non-Diabetic - 3-6%   Control Diabetic - 6-8%) CBG Random:: 110mg /dL

## 2010-05-19 ENCOUNTER — Telehealth (INDEPENDENT_AMBULATORY_CARE_PROVIDER_SITE_OTHER): Payer: Self-pay | Admitting: Nurse Practitioner

## 2010-05-28 NOTE — Progress Notes (Signed)
Summary: Endocrinologist  Phone Note Call from Patient   Summary of Call: spoke with pt's sister Harriett Rush [phone] wants to know if he can be referred to endocrinologist.  she says the is is still not stable on the 70/30 of insulin and would like to know if it would be an option for him to go and see if they can get him stable.  Initial call taken by: Levon Hedger,  May 19, 2010 3:51 PM  Follow-up for Phone Call        endocrinology referral ordered schedule pt and notifypt/sister Follow-up by: Lehman Prom FNP,  May 19, 2010 5:33 PM  Additional Follow-up for Phone Call Additional follow up Details #1::        I send the referral to [facility] so they can refer pt to a specialist for uninsured pts . Waiting for an appt.Cheryll Dessert  May 20, 2010 9:50 AM

## 2010-08-19 NOTE — H&P (Signed)
NAME:  DWAIN, MERLAN NO.:  [id number]   MEDICAL RECORD NO.:  [id number]          PATIENT TYPE:  IPS   LOCATION:  0506                          FACILITY:  [facility]   PHYSICIAN:  Carlton Adam, M.D.      DATE OF BIRTH:  June 24, 1950   DATE OF ADMISSION:  12/24/2006  DATE OF DISCHARGE:                       PSYCHIATRIC ADMISSION ASSESSMENT   IDENTIFYING INFORMATION:  This is a voluntary admission to the services  of Dr. Carlton Adam.  This is a 60 year old married white male.  He was  brought here as a walk-in yesterday by his sister.  Apparently, he has  been unemployed on a regular basis for over a year.  He is depressed.  He was here with his sister.  His sister brought a copy of a letter that  he wrote to his family, this was dated August 26th, implying that he was  going to take his life.  This is due to the fact that he cannot find  work.  He states that suicidal ideation has subsided, because I  couldn't go through with it.  He was evasive, however, during the  intake about how recently he had had a suicide plan.  He does have a  history for treatment for bipolar disorder.  However, it has been years  since he took any medications.  His wife used to be a Therapist, sports here and she,  herself, is recently unemployed.   PAST PSYCHIATRIC HISTORY:  Ten to 15 years ago he was treated in  [place] as an inpatient, and he was an outpatient almost 10 years ago  with Dr. Mariel Kansky.  He states at that time he took Wellbutrin but he felt  numb and did not continue it.   SOCIAL HISTORY:  He graduated in 1975 with a BS in physics.  He has been  married once.  He has four children, all are older, 72-32.  His wife is  an Therapist, sports.  She recently lost her employment and the circumstances are not  completely clear.   FAMILY HISTORY:  He has an uncle who abused alcohol.  He denies any  alcohol or drug history himself.   PRIMARY CARE PHYSICIAN:  He has no primary care physician.   MEDICATIONS:  He does  buy medications for his insulin-dependent diabetes  over-the-counter.  He reports that he uses Humulin R 4 units b.i.d. and  NPH 30 units b.i.d.   ALLERGIES:  He has no known drug allergies.   PHYSICAL EXAMINATION:  GENERAL:  His physical examination was  unremarkable.  VITAL SIGNS:  He is 72 inches tall, weighs 187.5 pounds, temperature is  97.4, blood pressure 132/84, pulse 63, respirations 18.  SKIN:  He had a chest tube due to a collapsed lung in 1979 and there is  a scar on his right chest wall for that.   LABORATORY DATA:  His blood sugar has been ranging.  Last night at 6:30,  it was 239.  This morning, it was 61.  No other labs are available.   MENTAL STATUS EXAM:  Today, he is alert and oriented.  He is  appropriately groomed,  dressed and nourished.  His speech is not  pressured.  His mood is appropriate to the situation.  His affect is a  little anxious.  His thought processes are clear, rational and goal-  oriented.  He is requesting discharge.  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  He does not want to lose that job.  Judgment and  insight are good.  Concentration and memory are intact.  Intelligence is  at least average.  He denies being actively suicidal or homicidal.  He  denies auditory or visual hallucinations.   DIAGNOSES:  AXIS I:  Mood disorder, not otherwise specified.  AXIS II:  Deferred.  AXIS III:  Insulin-dependent diabetes mellitus.  AXIS IV:  Occupational, financial and marital issues.  AXIS V:  35.   PLAN:  Dr. Sabra Heck has spoken with the patient who has also requested  discharge from Dr. Sabra Heck.  Dr. Sabra Heck is thinking that the patient might  need to be started on a mood stabilizer; however, the patient is very  reluctant to do anything except be discharged due to finances, etc.  Dr.  Sabra Heck apparently did speak with the wife on the phone and she is okay  with him being discharged.  A total decision has not been made yet.      Mickie Kerry Dory, P.A.-C.      Carlton Adam, M.D.  Electronically Signed    MD/MEDQ  D:  12/25/2006  T:  12/26/2006  Job:  [id number]

## 2010-08-22 NOTE — Discharge Summary (Signed)
NAME:  LINDA, SEPER NO.:  [id number]   MEDICAL RECORD NO.:  [id number]          PATIENT TYPE:  IPS   LOCATION:  0506                          FACILITY[facility]  [facility]   PHYSICIAN:  Carlton Adam, M.D.      DATE OF BIRTH:  [birth year]   DATE OF ADMISSION:  12/24/2006  DATE OF DISCHARGE:  12/25/2006                               DISCHARGE SUMMARY   CHIEF COMPLAINT AND PRESENT ILLNESS:  This was the first admission to  [facility] for this 60 year old married white male  brought as a walk-in by sister.  Had been unemployed on a regular basis  for over a year, feeling depressed.  His sister brought a copy of a  letter that he wrote to his family dated November 30, 2006 implying that  he was going to take his life.  This was due to the fact that he could  not find work.  Upon this evaluation, stated that suicidal ideas have  subsided because he could not go through with it.  He was evasive.   PAST PSYCHIATRIC HISTORY:  Has a history of having had treatment for  bipolar disorder.  It has been years since he was prescribed any  medications.  10-15 years ago, he was treated in [place] as an  inpatient and he was in outpatient almost 10 years with Dr. Mariel Kansky.  At  that time, he took Wellbutrin which made him feel numb.  He did not  continue.   MEDICAL HISTORY:  Insulin-dependent diabetes mellitus.   MEDICATIONS:  Humulin R 4 units twice a day and NPH 30 units twice a  day.   PHYSICAL EXAMINATION:  Performed and failed to show any acute findings.   LABORATORY DATA:  Results not available in the chart.   MENTAL STATUS EXAM:  Alert cooperative male, appropriately groomed,  dressed and nourished.  Speech was normal in rate, tempo and production.  Mood is anxious, depressed.  Affect anxious.  Thought processes are  clear, rational and goal-oriented.  He is requesting discharge.  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  He did not want to lose that  job.   No delusions.  No active suicidal or homicidal ideation.  No  hallucinations.  Cognition well-preserved.   ADMISSION DIAGNOSES:  AXIS I:  Mood disorder not otherwise specified.  AXIS II:  No diagnosis.  AXIS III:  Insulin-dependent diabetes mellitus.  AXIS IV:  Moderate.  AXIS V:  GAF upon admission 35; highest GAF in the last year 31.   HOSPITAL COURSE:  He was admitted and started in individual and group  psychotherapy.  As already stated, he had had issues with jobs, working  Tourist information centre manager until 2007.  Had a falling out with the company.  Thought that  he could find a job easily.  He has not been able to get one.  Working  temporary jobs.  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  Three to four weeks prior to this admission, he wrote a  suicide note.  Lost his job in July  of 2007.  Wife also lost her job.  This created a lot of financial pressures.  He has five children.  He  agreed to come only to be assessed but he would not be willing to stay.  Wife was contacted who endorsed that she did not feel that he was going  to hurt himself.  He was started on Depakote 250 mg twice a day due to  the history of anger mood changes, impulsivity.  By December 25, 2006,  he was in full contact with reality.  Willing to try the Depakote.  Willing to pursue outpatient treatment.  There were no concerns about  his safety so we went ahead and discharged to outpatient follow-up.   DISCHARGE DIAGNOSES:  AXIS I:  Mood disorder not otherwise specified.  AXIS II:  No diagnosis.  AXIS III:  Insulin-dependent diabetes mellitus.  AXIS IV:  Moderate.  AXIS V:  GAF upon discharge 55-60.   DISCHARGE MEDICATIONS:  Depakote ER 500 mg at bedtime.   FOLLOWUP:  [facility] and to be sure that he was going to check  with his PCP for follow-up for his increased TSH.  Proper information  given.      Carlton Adam, M.D.  Electronically Signed     IL/MEDQ  D:  01/17/2007  T:   01/17/2007  Job:  [id number]

## 2011-01-15 LAB — HEMOGLOBIN A1C
Hgb A1c MFr Bld: 8.2 — ABNORMAL HIGH
Mean Plasma Glucose: 215

## 2011-01-15 LAB — CBC
HCT: 46.9
Hemoglobin: 16.4
MCHC: 35
MCV: 90.7
Platelets: 239
RBC: 5.17
RDW: 12.5
WBC: 6.5

## 2011-01-15 LAB — COMPREHENSIVE METABOLIC PANEL
ALT: 22
AST: 22
Albumin: 3.9
Alkaline Phosphatase: 82
BUN: 14
CO2: 30
Calcium: 9.2
Chloride: 101
Creatinine, Ser: 0.98
GFR calc Af Amer: >60
GFR calc non Af Amer: >60
Glucose, Bld: 61 — ABNORMAL LOW
Potassium: 3.6
Sodium: 140
Total Bilirubin: 1.5 — ABNORMAL HIGH
Total Protein: 6.6

## 2011-01-15 LAB — TSH: TSH: 12.372 — ABNORMAL HIGH

## 2014-06-01 ENCOUNTER — Encounter (HOSPITAL_COMMUNITY): Payer: Self-pay | Admitting: Emergency Medicine

## 2014-06-01 ENCOUNTER — Emergency Department (HOSPITAL_COMMUNITY)
Admission: EM | Admit: 2014-06-01 | Discharge: 2014-06-01 | Disposition: A | Discharge status: Home / Self Care | Payer: 59 | Attending: Emergency Medicine | Admitting: Emergency Medicine

## 2014-06-01 DIAGNOSIS — Z794 Long term (current) use of insulin: Secondary | ICD-10-CM | POA: Diagnosis not present

## 2014-06-01 DIAGNOSIS — Z041 Encounter for examination and observation following transport accident: Secondary | ICD-10-CM | POA: Diagnosis not present

## 2014-06-01 DIAGNOSIS — E039 Hypothyroidism, unspecified: Secondary | ICD-10-CM | POA: Diagnosis not present

## 2014-06-01 DIAGNOSIS — Z79899 Other long term (current) drug therapy: Secondary | ICD-10-CM | POA: Diagnosis not present

## 2014-06-01 DIAGNOSIS — Z8709 Personal history of other diseases of the respiratory system: Secondary | ICD-10-CM | POA: Insufficient documentation

## 2014-06-01 DIAGNOSIS — Z7982 Long term (current) use of aspirin: Secondary | ICD-10-CM | POA: Diagnosis not present

## 2014-06-01 DIAGNOSIS — Z791 Long term (current) use of non-steroidal anti-inflammatories (NSAID): Secondary | ICD-10-CM | POA: Diagnosis not present

## 2014-06-01 DIAGNOSIS — Y9389 Activity, other specified: Secondary | ICD-10-CM | POA: Insufficient documentation

## 2014-06-01 DIAGNOSIS — E162 Hypoglycemia, unspecified: Secondary | ICD-10-CM

## 2014-06-01 DIAGNOSIS — E10649 Type 1 diabetes mellitus with hypoglycemia without coma: Secondary | ICD-10-CM | POA: Insufficient documentation

## 2014-06-01 DIAGNOSIS — Y92410 Unspecified street and highway as the place of occurrence of the external cause: Secondary | ICD-10-CM | POA: Insufficient documentation

## 2014-06-01 DIAGNOSIS — Y998 Other external cause status: Secondary | ICD-10-CM | POA: Insufficient documentation

## 2014-06-01 HISTORY — DX: Hypothyroidism, unspecified: E03.9

## 2014-06-01 HISTORY — DX: Type 1 diabetes mellitus without complications: E10.9

## 2014-06-01 HISTORY — DX: Pneumothorax, unspecified: J93.9

## 2014-06-01 LAB — URINALYSIS, ROUTINE W REFLEX MICROSCOPIC
Bilirubin Urine: NEGATIVE
Glucose, UA: 250 mg/dL — AB
Hgb urine dipstick: NEGATIVE
Ketones, ur: NEGATIVE mg/dL
Leukocytes, UA: NEGATIVE
Nitrite: NEGATIVE
Protein, ur: NEGATIVE mg/dL
Specific Gravity, Urine: 1.011 (ref 1.005–1.030)
Urobilinogen, UA: 0.2 mg/dL (ref 0.0–1.0)
pH: 7 (ref 5.0–8.0)

## 2014-06-01 LAB — COMPREHENSIVE METABOLIC PANEL
ALT: 28 U/L (ref 0–53)
AST: 33 U/L (ref 0–37)
Albumin: 4.2 g/dL (ref 3.5–5.2)
Alkaline Phosphatase: 79 U/L (ref 39–117)
Anion gap: 6 (ref 5–15)
BUN: 15 mg/dL (ref 6–23)
CO2: 31 mmol/L (ref 19–32)
Calcium: 9.1 mg/dL (ref 8.4–10.5)
Chloride: 105 mmol/L (ref 96–112)
Creatinine, Ser: 0.75 mg/dL (ref 0.50–1.35)
GFR calc Af Amer: >90 mL/min (ref >90)
GFR calc non Af Amer: >90 mL/min (ref >90)
Glucose, Bld: 136 mg/dL — ABNORMAL HIGH (ref 70–99)
Potassium: 3.8 mmol/L (ref 3.5–5.1)
Sodium: 142 mmol/L (ref 135–145)
Total Bilirubin: 1.3 mg/dL — ABNORMAL HIGH (ref 0.3–1.2)
Total Protein: 7.3 g/dL (ref 6.0–8.3)

## 2014-06-01 LAB — CBC WITH DIFFERENTIAL/PLATELET
Basophils Absolute: 0.1 10*3/uL (ref 0.0–0.1)
Basophils Relative: 1 % (ref 0–1)
Eosinophils Absolute: 0.1 10*3/uL (ref 0.0–0.7)
Eosinophils Relative: 1 % (ref 0–5)
HCT: 45.9 % (ref 39.0–52.0)
Hemoglobin: 15.5 g/dL (ref 13.0–17.0)
Lymphocytes Relative: 7 % — ABNORMAL LOW (ref 12–46)
Lymphs Abs: 0.6 10*3/uL — ABNORMAL LOW (ref 0.7–4.0)
MCH: 32.2 pg (ref 26.0–34.0)
MCHC: 33.8 g/dL (ref 30.0–36.0)
MCV: 95.4 fL (ref 78.0–100.0)
Monocytes Absolute: 0.8 10*3/uL (ref 0.1–1.0)
Monocytes Relative: 10 % (ref 3–12)
Neutro Abs: 6.4 10*3/uL (ref 1.7–7.7)
Neutrophils Relative %: 81 % — ABNORMAL HIGH (ref 43–77)
Platelets: 219 10*3/uL (ref 150–400)
RBC: 4.81 MIL/uL (ref 4.22–5.81)
RDW: 12.8 % (ref 11.5–15.5)
WBC: 7.9 10*3/uL (ref 4.0–10.5)

## 2014-06-01 LAB — CBG MONITORING, ED
Glucose-Capillary: 49 mg/dL — ABNORMAL LOW (ref 70–99)
Glucose-Capillary: 143 mg/dL — ABNORMAL HIGH (ref 70–99)
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
## 2014-06-01 NOTE — Discharge Instructions (Signed)
Decrease your insulin to 4 units of R and 20 units of N twice a day.  Follow up with your md next week.

## 2014-06-01 NOTE — ED Notes (Signed)
Pt given orange juice, sandwich and cheese.
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
## 2015-10-16 DIAGNOSIS — E1065 Type 1 diabetes mellitus with hyperglycemia: Secondary | ICD-10-CM | POA: Diagnosis not present

## 2015-10-16 DIAGNOSIS — E782 Mixed hyperlipidemia: Secondary | ICD-10-CM | POA: Diagnosis not present

## 2015-12-01 ENCOUNTER — Emergency Department (HOSPITAL_COMMUNITY)
Admission: EM | Admit: 2015-12-01 | Discharge: 2015-12-01 | Disposition: A | Discharge status: Home / Self Care | Payer: PPO | Attending: Emergency Medicine | Admitting: Emergency Medicine

## 2015-12-01 ENCOUNTER — Emergency Department (HOSPITAL_COMMUNITY): Payer: PPO

## 2015-12-01 ENCOUNTER — Encounter (HOSPITAL_COMMUNITY): Payer: Self-pay | Admitting: *Deleted

## 2015-12-01 DIAGNOSIS — R030 Elevated blood-pressure reading, without diagnosis of hypertension: Secondary | ICD-10-CM | POA: Diagnosis not present

## 2015-12-01 DIAGNOSIS — I499 Cardiac arrhythmia, unspecified: Secondary | ICD-10-CM | POA: Diagnosis not present

## 2015-12-01 DIAGNOSIS — R4182 Altered mental status, unspecified: Secondary | ICD-10-CM

## 2015-12-01 DIAGNOSIS — E162 Hypoglycemia, unspecified: Secondary | ICD-10-CM

## 2015-12-01 DIAGNOSIS — E161 Other hypoglycemia: Secondary | ICD-10-CM | POA: Diagnosis not present

## 2015-12-01 DIAGNOSIS — I10 Essential (primary) hypertension: Secondary | ICD-10-CM | POA: Diagnosis not present

## 2015-12-01 DIAGNOSIS — E10649 Type 1 diabetes mellitus with hypoglycemia without coma: Secondary | ICD-10-CM | POA: Insufficient documentation

## 2015-12-01 DIAGNOSIS — E039 Hypothyroidism, unspecified: Secondary | ICD-10-CM | POA: Diagnosis not present

## 2015-12-01 DIAGNOSIS — Z7982 Long term (current) use of aspirin: Secondary | ICD-10-CM | POA: Diagnosis not present

## 2015-12-01 LAB — CBC WITH DIFFERENTIAL/PLATELET
Basophils Absolute: 0.1 10*3/uL (ref 0.0–0.1)
Basophils Relative: 1 %
Eosinophils Absolute: 0.1 10*3/uL (ref 0.0–0.7)
Eosinophils Relative: 1 %
HCT: 47.6 % (ref 39.0–52.0)
Hemoglobin: 16.1 g/dL (ref 13.0–17.0)
Lymphocytes Relative: 11 %
Lymphs Abs: 1.1 10*3/uL (ref 0.7–4.0)
MCH: 32.2 pg (ref 26.0–34.0)
MCHC: 33.8 g/dL (ref 30.0–36.0)
MCV: 95.2 fL (ref 78.0–100.0)
Monocytes Absolute: 0.8 10*3/uL (ref 0.1–1.0)
Monocytes Relative: 8 %
Neutro Abs: 7.9 10*3/uL — ABNORMAL HIGH (ref 1.7–7.7)
Neutrophils Relative %: 79 %
Platelets: 242 10*3/uL (ref 150–400)
RBC: 5 MIL/uL (ref 4.22–5.81)
RDW: 12.5 % (ref 11.5–15.5)
WBC: 9.9 10*3/uL (ref 4.0–10.5)

## 2015-12-01 LAB — BASIC METABOLIC PANEL
Anion gap: 5 (ref 5–15)
BUN: 15 mg/dL (ref 6–20)
CO2: 28 mmol/L (ref 22–32)
Calcium: 9.1 mg/dL (ref 8.9–10.3)
Chloride: 105 mmol/L (ref 101–111)
Creatinine, Ser: 1.12 mg/dL (ref 0.61–1.24)
GFR calc Af Amer: >60 mL/min (ref >60)
GFR calc non Af Amer: >60 mL/min (ref >60)
Glucose, Bld: 147 mg/dL — ABNORMAL HIGH (ref 65–99)
Potassium: 4.7 mmol/L (ref 3.5–5.1)
Sodium: 138 mmol/L (ref 135–145)

## 2015-12-01 LAB — URINALYSIS, ROUTINE W REFLEX MICROSCOPIC
Bilirubin Urine: NEGATIVE
Glucose, UA: 500 mg/dL — AB
Hgb urine dipstick: NEGATIVE
Ketones, ur: NEGATIVE mg/dL
Leukocytes, UA: NEGATIVE
Nitrite: NEGATIVE
Protein, ur: NEGATIVE mg/dL
Specific Gravity, Urine: 1.014 (ref 1.005–1.030)
pH: 6.5 (ref 5.0–8.0)

## 2015-12-01 LAB — I-STAT TROPONIN, ED: Troponin i, poc: 0.01 ng/mL (ref 0.00–0.08)

## 2015-12-01 LAB — CBG MONITORING, ED
Glucose-Capillary: 176 mg/dL — ABNORMAL HIGH (ref 65–99)
Glucose-Capillary: 104 mg/dL — ABNORMAL HIGH (ref 65–99)

## 2015-12-01 NOTE — Discharge Instructions (Signed)
Follow up with your primary care provider as soon as possible. If you need a new one please call the phone number listed in this packet for assistance in establishing primary care. Please take your metformin and insulin only as prescribed. I suspect you accidentally took too much insulin this morning causing your sugar to fall so low. Your blood pressure was also elevated at today's visit. Please talk to your primary care provider about your blood pressure as it will need to be evaluated.
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
## 2015-12-01 NOTE — ED Notes (Signed)
Patient transported to X-ray 

## 2015-12-01 NOTE — ED Notes (Addendum)
Pt is in stable condition upon d/c and ambulates from ED. 

## 2015-12-01 NOTE — ED Notes (Signed)
Attempted to draw blood off IV, unable to draw back.

## 2015-12-01 NOTE — ED Provider Notes (Signed)
[facility]-EMER[facility]ENCY DEPT Provider Note   CSN: [id number] Arrival date & time: 12/01/15  1311   History   Chief Complaint Chief Complaint  Patient presents with  . Altered Mental Status  . Hypoglycemia  . Irregular Heart Beat    HPI  Cory [name] is an 65 y.o. male with history of IDDM who presents to the ED for evaluation of hypoglycemia and altered mental status. EMS was called because pt was found sitting in his car at a stoplight in the middle of the intersection. In the ED pt states he remembers leaving church and feeling like his sugar was low. He states he remembers trying to find his way home but passing the road to his house several times. He states the next thing he remembers is being with EMS. Per EMS, on arrival pt was pale, diaphoretic, and altered. CBG was 29. Pt was given 12G oral glucose then 25g IV glucose and repeat CBG 301. In the ED pt is oriented but has a bit of an odd/confused affect, though states he feels at baseline. He states this happens somewhat frequently where his blood sugar will drop low. He states he has been taking ~25 units of his NovoLog 70/30 TID as prescribed. States typically his sugars run high on the weekends. This AM he states he checked it and it was 70 so he ate breakfast then took his insulin. Pt states he is also supposed to be taking metformin but doesn't take it because of GI upset. With EMS pt was also found to have an irregular heart rhythm on monitor, possibly first degree AV block. Pt states he has never been diagnosed with an arrhythmia. He denies chest pain or SOB. Denies palpitations. Denies history of HTN though it is listed in his problem list. Denies taking any other medications regularly.   Past Medical History:  Diagnosis Date  . Hypothyroidism   . Pneumothorax   . Type 1 diabetes mellitus     Patient Active Problem List   Diagnosis Date Noted  . DENTAL CARIES 02/14/2010  . HYPERTENSION, BENIGN ESSENTIAL 10/11/2009  .  SEBORRHEIC KERATOSES, HX OF 09/12/2009  . HYPERCHOLESTEROLEMIA 03/06/2009  . HYPOTHYROIDISM 02/08/2009  . DIABETES MELLITUS, TYPE II, ON INSULIN 04/06/1977    Past Surgical History:  Procedure Laterality Date  . CHEST TUBE INSERTION      Home Medications    Prior to Admission medications   Medication Sig Start Date End Date Taking? Authorizing Provider  aspirin 81 MG tablet Take 81 mg by mouth daily.    Historical Provider, MD  ibuprofen (ADVIL,MOTRIN) 200 MG tablet Take 400 mg by mouth daily.    Historical Provider, MD  insulin NPH Human (HUMULIN N,NOVOLIN N) 100 UNIT/ML injection Inject 22 Units into the skin 2 (two) times daily.    Historical Provider, MD  insulin regular (NOVOLIN R,HUMULIN R) 100 units/mL injection Inject 5 Units into the skin 2 (two) times daily.    Historical Provider, MD  levothyroxine (SYNTHROID, LEVOTHROID) 75 MCG tablet Take 75 mcg by mouth daily before breakfast.    Historical Provider, MD    Family History No family history on file.  Social History Social History  Substance Use Topics  . Smoking status: Never Smoker  . Smokeless tobacco: Not on file  . Alcohol use Yes     Allergies   Review of patient's allergies indicates no known allergies.   Review of Systems Review of Systems 10 Systems reviewed and are negative for acute change  except as noted in the HPI.  \Physical Exam Updated Vital Signs BP (!) 203/83 (BP Location: Right Arm)   Temp 97.6 F (36.4 C)   Resp 18   Ht 6' (1.829 m)   Wt 83.9 kg   SpO2 100%   BMI 25.09 kg/m   Physical Exam  Constitutional: He is oriented to person, place, and time.  HENT:  Right Ear: External ear normal.  Left Ear: External ear normal.  Nose: Nose normal.  Mouth/Throat: Oropharynx is clear and moist. No oropharyngeal exudate.  Eyes: Conjunctivae are normal.  Neck: Neck supple.  Cardiovascular: Normal rate, regular rhythm, normal heart sounds and intact distal pulses.   Pulmonary/Chest:  Effort normal and breath sounds normal. No respiratory distress. He has no wheezes.  Abdominal: Soft. Bowel sounds are normal. He exhibits no distension. There is no tenderness. There is no rebound and no guarding.  Musculoskeletal: He exhibits no edema.  Lymphadenopathy:    He has no cervical adenopathy.  Neurological: He is alert and oriented to person, place, and time. No cranial nerve deficit.  Alert and oriented x 3 but odd affect Normal finger to nose No pronator drift Moves all extremities freely Intact strength throughout  Skin: Skin is warm and dry.  Psychiatric: He has a normal mood and affect.  Nursing note and vitals reviewed.    ED Treatments / Results  Labs (all labs ordered are listed, but only abnormal results are displayed) Labs Reviewed  BASIC METABOLIC PANEL - Abnormal; Notable for the following:       Result Value   Glucose, Bld 147 (*)    All other components within normal limits  CBC WITH DIFFERENTIAL/PLATELET - Abnormal; Notable for the following:    Neutro Abs 7.9 (*)    All other components within normal limits  URINALYSIS, ROUTINE W REFLEX MICROSCOPIC (NOT AT [facility]) - Abnormal; Notable for the following:    Glucose, UA 500 (*)    All other components within normal limits  HEMOGLOBIN A1C - Abnormal; Notable for the following:    Hgb A1c MFr Bld 7.2 (*)    All other components within normal limits  CBG MONITORING, ED - Abnormal; Notable for the following:    Glucose-Capillary 176 (*)    All other components within normal limits  CBG MONITORING, ED - Abnormal; Notable for the following:    Glucose-Capillary 104 (*)    All other components within normal limits  I-STAT TROPOININ, ED  CBG MONITORING, ED    EKG  EKG Interpretation  Date/Time:  Sunday December 01 2015 13:26:21 EDT Ventricular Rate:  66 PR Interval:    QRS Duration: 101 QT Interval:  443 QTC Calculation: 443 R Axis:   -7 Text Interpretation:  Sinus rhythm Paired ventricular  premature complexes Aberrant conduction of SV complex(es) Borderline prolonged PR interval Borderline low voltage, extremity leads No previous tracing Confirmed by KNOTT MD, DANIEL ([id number]) on 12/02/2015 10:54:36 AM       Radiology Dg Chest 2 View  Result Date: 12/01/2015 CLINICAL DATA:  Elevated blood pressure. EXAM: CHEST  2 VIEW COMPARISON:  None. FINDINGS: The heart size and mediastinal contours are within normal limits. Postoperative appearance of the right upper lobe noted. Calcified granulomas noted in the left upper lobe. Spondylosis noted throughout the thoracic spine. IMPRESSION: 1. No acute cardiopulmonary abnormalities. Electronically Signed   By: Signa Kellaylor  Stroud M.D.   On: 12/01/2015 14:37    Procedures Procedures (including critical care time)  Medications Ordered in ED  Medications - No data to display   Initial Impression / Assessment and Plan / ED Course  I have reviewed the triage vital signs and the nursing notes.  Pertinent labs & imaging results that were available during my care of the patient were reviewed by me and considered in my medical decision making (see chart for details).  Clinical Course    Pt presenting after syncope and altered mental status likely secondary to hypoglycemia. Has remained asymtomatic in the ED. CBG stable. I suspect pt's hypoglycemia is due to his self-adjusted sliding scale of his humalog 70/30. He also admits to not taking his metformin. I did encourage close follow up with PCP for optimization of diabetes medications. A1C is ordered and pending. Encouraged f/u with cardiology as well. Pt ambulatory and nontoxic appearing VSS. ER return precautions given.  Final Clinical Impressions(s) / ED Diagnoses   Final diagnoses:  Hypoglycemia  Altered mental status, unspecified altered mental status type  Essential hypertension    New Prescriptions Discharge Medication List as of 12/01/2015  5:14 PM       Carlene Coria, PA-C 12/02/15  1441    Nelva Nay, MD 12/18/15 1035

## 2015-12-01 NOTE — ED Notes (Signed)
Phlebotomy at bedside.

## 2015-12-02 LAB — HEMOGLOBIN A1C
Hgb A1c MFr Bld: 7.2 % — ABNORMAL HIGH (ref 4.8–5.6)
Mean Plasma Glucose: 160 mg/dL

## 2016-01-06 DIAGNOSIS — E1065 Type 1 diabetes mellitus with hyperglycemia: Secondary | ICD-10-CM | POA: Diagnosis not present

## 2016-01-06 DIAGNOSIS — E782 Mixed hyperlipidemia: Secondary | ICD-10-CM | POA: Diagnosis not present

## 2016-01-16 DIAGNOSIS — E039 Hypothyroidism, unspecified: Secondary | ICD-10-CM | POA: Diagnosis not present

## 2016-01-16 DIAGNOSIS — Z23 Encounter for immunization: Secondary | ICD-10-CM | POA: Diagnosis not present

## 2016-01-16 DIAGNOSIS — E782 Mixed hyperlipidemia: Secondary | ICD-10-CM | POA: Diagnosis not present

## 2016-01-16 DIAGNOSIS — F329 Major depressive disorder, single episode, unspecified: Secondary | ICD-10-CM | POA: Diagnosis not present

## 2016-01-21 DIAGNOSIS — E1065 Type 1 diabetes mellitus with hyperglycemia: Secondary | ICD-10-CM | POA: Diagnosis not present

## 2016-01-30 DIAGNOSIS — E1065 Type 1 diabetes mellitus with hyperglycemia: Secondary | ICD-10-CM | POA: Diagnosis not present

## 2016-02-04 DIAGNOSIS — E1065 Type 1 diabetes mellitus with hyperglycemia: Secondary | ICD-10-CM | POA: Diagnosis not present

## 2016-02-18 DIAGNOSIS — E1065 Type 1 diabetes mellitus with hyperglycemia: Secondary | ICD-10-CM | POA: Diagnosis not present

## 2016-02-25 DIAGNOSIS — E1065 Type 1 diabetes mellitus with hyperglycemia: Secondary | ICD-10-CM | POA: Diagnosis not present

## 2016-03-03 DIAGNOSIS — H5203 Hypermetropia, bilateral: Secondary | ICD-10-CM | POA: Diagnosis not present

## 2016-03-03 DIAGNOSIS — H35043 Retinal micro-aneurysms, unspecified, bilateral: Secondary | ICD-10-CM | POA: Diagnosis not present

## 2016-03-03 DIAGNOSIS — E119 Type 2 diabetes mellitus without complications: Secondary | ICD-10-CM | POA: Diagnosis not present

## 2016-03-03 DIAGNOSIS — H25813 Combined forms of age-related cataract, bilateral: Secondary | ICD-10-CM | POA: Diagnosis not present

## 2016-03-03 DIAGNOSIS — Z7984 Long term (current) use of oral hypoglycemic drugs: Secondary | ICD-10-CM | POA: Diagnosis not present

## 2016-03-03 DIAGNOSIS — H52223 Regular astigmatism, bilateral: Secondary | ICD-10-CM | POA: Diagnosis not present

## 2016-03-23 DIAGNOSIS — E039 Hypothyroidism, unspecified: Secondary | ICD-10-CM | POA: Diagnosis not present

## 2016-03-23 DIAGNOSIS — E782 Mixed hyperlipidemia: Secondary | ICD-10-CM | POA: Diagnosis not present

## 2016-04-03 DIAGNOSIS — E109 Type 1 diabetes mellitus without complications: Secondary | ICD-10-CM | POA: Diagnosis not present

## 2016-05-04 DIAGNOSIS — E109 Type 1 diabetes mellitus without complications: Secondary | ICD-10-CM | POA: Diagnosis not present

## 2016-05-05 ENCOUNTER — Encounter: Payer: Self-pay | Admitting: Podiatry

## 2016-05-05 ENCOUNTER — Ambulatory Visit (INDEPENDENT_AMBULATORY_CARE_PROVIDER_SITE_OTHER): Payer: PPO | Admitting: Podiatry

## 2016-05-05 VITALS — BP 177/89 | HR 64

## 2016-05-05 DIAGNOSIS — L600 Ingrowing nail: Secondary | ICD-10-CM | POA: Diagnosis not present

## 2016-05-05 DIAGNOSIS — L03031 Cellulitis of right toe: Secondary | ICD-10-CM | POA: Diagnosis not present

## 2016-05-05 MED ORDER — CEPHALEXIN 500 MG PO CAPS: 500.0000 mg | ORAL_CAPSULE | Freq: Two times a day (BID) | ORAL | 0 refills | Status: AC

## 2016-05-05 NOTE — Progress Notes (Signed)
   Subjective:    Patient ID: Cory [name], male    DOB: 1950-04-14, 66 y.o.   MRN: [id number]  HPI this patient presents the office with chief complaint of a painful right great toe. Patient states he has been having problems with ingrown toenails for a long time. He states that he was working on the nail himself on the right great toe 3 weeks ago and pulled off part of the skin. He says it then started bleeding and has gotten worse over time. He presents the office today with a red, swollen, painful outside border big toe, right foot. No self treatment nor sought any professional help. This patient does admit that he is diabetic and on an insulin pump. He says that his sugar is very high, but would not give me an actual value of the blood sugar. He presents the office today for an evaluation and treatment of this condition    Review of Systems  HENT: Positive for hearing loss.   Endocrine: Positive for cold intolerance.       Objective:   Physical Exam GENERAL APPEARANCE: Alert, conversant. Appropriately groomed. No acute distress.  VASCULAR: Pedal pulses are  palpable at  [facility] bilateral.  Capillary refill time is immediate to all digits,  Normal temperature gradient.  Digital hair growth is present bilateral  NEUROLOGIC: sensation is normal to 5.07 monofilament at 5/5 sites bilateral.  Light touch is intact bilateral, Muscle strength normal.  MUSCULOSKELETAL: acceptable muscle strength, tone and stability bilateral.  Intrinsic muscluature intact bilateral.  Rectus appearance of foot and digits noted bilateral.   DERMATOLOGIC: skin color, texture, and turgor are within normal limits.  No preulcerative lesions or ulcers  are seen, no interdigital maceration noted.  No open lesions present.  No drainage noted.  Nails  Red swollen draining lateral border right great toe.           Assessment & Plan:  Ingrown Toenail right hallux  Paronychia lateral border right great  toe.  IE  Nail surgery.  Treatment options and alternatives discussed.  Recommended an incision and drainage and patient agreed.  Right hallux  was prepped with alcohol and a 3cc. of  2% lidocaine plain was administered in a digital block fashion.  The toe was then prepped with betadine solution .  The offending nail border was then excised and all necrotic tissue was resected.  The area was then cleansed  and antibiotic ointment and a dry sterile dressing was applied.  The patient was dispensed instructions for aftercare. Cephalexin 500 mg prescribed.  RTC 1 week. He incision and drainage was performed on this patient only since I was not cognizant of the blood sugar value, which she says is uncontrollable \  Helane GuntherGregory Mayer DPM

## 2016-05-14 DIAGNOSIS — E109 Type 1 diabetes mellitus without complications: Secondary | ICD-10-CM | POA: Diagnosis not present

## 2016-05-15 ENCOUNTER — Encounter: Payer: Self-pay | Admitting: Podiatry

## 2016-05-15 ENCOUNTER — Ambulatory Visit (INDEPENDENT_AMBULATORY_CARE_PROVIDER_SITE_OTHER): Payer: Self-pay | Admitting: Podiatry

## 2016-05-15 DIAGNOSIS — Z09 Encounter for follow-up examination after completed treatment for conditions other than malignant neoplasm: Secondary | ICD-10-CM

## 2016-05-15 NOTE — Progress Notes (Addendum)
This patient returns to the office following nail surgery one week ago.  The patient says toe has been soaked and bandaged as directed.  There has been improvement of the toe since the surgery has been performed. The patient presents for continued evaluation and treatment.  GENERAL APPEARANCE: Alert, conversant. Appropriately groomed. No acute distress.  VASCULAR: Pedal pulses palpable at  [facility] and PT bilateral.  Capillary refill time is immediate to all digits,  Normal temperature gradient.    NEUROLOGIC: sensation is normal to 5.07 monofilament at 5/5 sites bilateral.  Light touch is intact bilateral, Muscle strength normal.  MUSCULOSKELETAL: acceptable muscle strength, tone and stability bilateral.  Intrinsic muscluature intact bilateral.  Rectus appearance of foot and digits noted bilateral.   DERMATOLOGIC: skin color, texture, and turgor are within normal limits.  No preulcerative lesions or ulcers  are seen, no interdigital maceration noted.   NAILS  There is necrotic tissue along the nail groove  In the absence of redness swelling and pain.  DX  S/p nail surgery  ROV  Home instructions were discussed.  Patient to call the office if there are any questions or concerns. Patient says he took antibiotics for 3-4 days until side effects to his stool occurred.   Helane GuntherGregory Dwan Fennel DPM

## 2016-05-25 DIAGNOSIS — E109 Type 1 diabetes mellitus without complications: Secondary | ICD-10-CM | POA: Diagnosis not present

## 2016-06-03 DIAGNOSIS — E109 Type 1 diabetes mellitus without complications: Secondary | ICD-10-CM | POA: Diagnosis not present

## 2016-06-09 DIAGNOSIS — E109 Type 1 diabetes mellitus without complications: Secondary | ICD-10-CM | POA: Diagnosis not present

## 2016-07-02 DIAGNOSIS — E109 Type 1 diabetes mellitus without complications: Secondary | ICD-10-CM | POA: Diagnosis not present

## 2016-07-07 DIAGNOSIS — E1065 Type 1 diabetes mellitus with hyperglycemia: Secondary | ICD-10-CM | POA: Diagnosis not present

## 2016-07-07 DIAGNOSIS — E782 Mixed hyperlipidemia: Secondary | ICD-10-CM | POA: Diagnosis not present

## 2016-07-21 DIAGNOSIS — Z23 Encounter for immunization: Secondary | ICD-10-CM | POA: Diagnosis not present

## 2016-07-21 DIAGNOSIS — E039 Hypothyroidism, unspecified: Secondary | ICD-10-CM | POA: Diagnosis not present

## 2016-07-21 DIAGNOSIS — E782 Mixed hyperlipidemia: Secondary | ICD-10-CM | POA: Diagnosis not present

## 2016-08-02 DIAGNOSIS — E109 Type 1 diabetes mellitus without complications: Secondary | ICD-10-CM | POA: Diagnosis not present

## 2016-08-13 DIAGNOSIS — E1065 Type 1 diabetes mellitus with hyperglycemia: Secondary | ICD-10-CM | POA: Diagnosis not present

## 2016-09-01 DIAGNOSIS — E109 Type 1 diabetes mellitus without complications: Secondary | ICD-10-CM | POA: Diagnosis not present

## 2016-09-04 DIAGNOSIS — E109 Type 1 diabetes mellitus without complications: Secondary | ICD-10-CM | POA: Diagnosis not present

## 2016-10-02 DIAGNOSIS — E109 Type 1 diabetes mellitus without complications: Secondary | ICD-10-CM | POA: Diagnosis not present

## 2016-11-01 DIAGNOSIS — E109 Type 1 diabetes mellitus without complications: Secondary | ICD-10-CM | POA: Diagnosis not present

## 2016-12-02 DIAGNOSIS — E109 Type 1 diabetes mellitus without complications: Secondary | ICD-10-CM | POA: Diagnosis not present

## 2016-12-14 DIAGNOSIS — E1065 Type 1 diabetes mellitus with hyperglycemia: Secondary | ICD-10-CM | POA: Diagnosis not present

## 2017-01-02 DIAGNOSIS — E109 Type 1 diabetes mellitus without complications: Secondary | ICD-10-CM | POA: Diagnosis not present

## 2017-01-26 DIAGNOSIS — E782 Mixed hyperlipidemia: Secondary | ICD-10-CM | POA: Diagnosis not present

## 2017-01-26 DIAGNOSIS — E1065 Type 1 diabetes mellitus with hyperglycemia: Secondary | ICD-10-CM | POA: Diagnosis not present

## 2017-02-01 DIAGNOSIS — E109 Type 1 diabetes mellitus without complications: Secondary | ICD-10-CM | POA: Diagnosis not present

## 2017-02-02 DIAGNOSIS — E782 Mixed hyperlipidemia: Secondary | ICD-10-CM | POA: Diagnosis not present

## 2017-03-04 DIAGNOSIS — E109 Type 1 diabetes mellitus without complications: Secondary | ICD-10-CM | POA: Diagnosis not present

## 2017-03-04 DIAGNOSIS — E1065 Type 1 diabetes mellitus with hyperglycemia: Secondary | ICD-10-CM | POA: Diagnosis not present

## 2017-03-09 DIAGNOSIS — E109 Type 1 diabetes mellitus without complications: Secondary | ICD-10-CM | POA: Diagnosis not present

## 2017-04-03 DIAGNOSIS — E109 Type 1 diabetes mellitus without complications: Secondary | ICD-10-CM | POA: Diagnosis not present

## 2017-04-27 DIAGNOSIS — E782 Mixed hyperlipidemia: Secondary | ICD-10-CM | POA: Diagnosis not present

## 2017-05-04 DIAGNOSIS — E782 Mixed hyperlipidemia: Secondary | ICD-10-CM | POA: Diagnosis not present

## 2017-05-04 DIAGNOSIS — E1065 Type 1 diabetes mellitus with hyperglycemia: Secondary | ICD-10-CM | POA: Diagnosis not present

## 2017-05-04 DIAGNOSIS — I10 Essential (primary) hypertension: Secondary | ICD-10-CM | POA: Diagnosis not present

## 2017-05-04 DIAGNOSIS — E039 Hypothyroidism, unspecified: Secondary | ICD-10-CM | POA: Diagnosis not present

## 2017-05-04 DIAGNOSIS — E1021 Type 1 diabetes mellitus with diabetic nephropathy: Secondary | ICD-10-CM | POA: Diagnosis not present

## 2017-05-04 DIAGNOSIS — E109 Type 1 diabetes mellitus without complications: Secondary | ICD-10-CM | POA: Diagnosis not present

## 2017-05-06 DIAGNOSIS — I10 Essential (primary) hypertension: Secondary | ICD-10-CM | POA: Diagnosis not present

## 2017-05-06 DIAGNOSIS — E1021 Type 1 diabetes mellitus with diabetic nephropathy: Secondary | ICD-10-CM | POA: Diagnosis not present

## 2017-06-14 DIAGNOSIS — E11319 Type 2 diabetes mellitus with unspecified diabetic retinopathy without macular edema: Secondary | ICD-10-CM | POA: Diagnosis not present

## 2017-06-14 DIAGNOSIS — Z794 Long term (current) use of insulin: Secondary | ICD-10-CM | POA: Diagnosis not present

## 2017-06-14 DIAGNOSIS — H25813 Combined forms of age-related cataract, bilateral: Secondary | ICD-10-CM | POA: Diagnosis not present

## 2017-06-14 DIAGNOSIS — H5203 Hypermetropia, bilateral: Secondary | ICD-10-CM | POA: Diagnosis not present

## 2017-06-14 DIAGNOSIS — H35043 Retinal micro-aneurysms, unspecified, bilateral: Secondary | ICD-10-CM | POA: Diagnosis not present

## 2017-06-17 DIAGNOSIS — E1065 Type 1 diabetes mellitus with hyperglycemia: Secondary | ICD-10-CM | POA: Diagnosis not present

## 2017-06-22 DIAGNOSIS — E1021 Type 1 diabetes mellitus with diabetic nephropathy: Secondary | ICD-10-CM | POA: Diagnosis not present

## 2017-06-29 DIAGNOSIS — E039 Hypothyroidism, unspecified: Secondary | ICD-10-CM | POA: Diagnosis not present

## 2017-06-29 DIAGNOSIS — E1021 Type 1 diabetes mellitus with diabetic nephropathy: Secondary | ICD-10-CM | POA: Diagnosis not present

## 2017-06-29 DIAGNOSIS — E1065 Type 1 diabetes mellitus with hyperglycemia: Secondary | ICD-10-CM | POA: Diagnosis not present

## 2017-06-29 DIAGNOSIS — I10 Essential (primary) hypertension: Secondary | ICD-10-CM | POA: Diagnosis not present

## 2017-06-29 DIAGNOSIS — E109 Type 1 diabetes mellitus without complications: Secondary | ICD-10-CM | POA: Diagnosis not present

## 2017-06-29 DIAGNOSIS — E782 Mixed hyperlipidemia: Secondary | ICD-10-CM | POA: Diagnosis not present

## 2017-07-22 DIAGNOSIS — E1065 Type 1 diabetes mellitus with hyperglycemia: Secondary | ICD-10-CM | POA: Diagnosis not present

## 2017-07-28 DIAGNOSIS — E109 Type 1 diabetes mellitus without complications: Secondary | ICD-10-CM | POA: Diagnosis not present

## 2017-08-04 DIAGNOSIS — E1065 Type 1 diabetes mellitus with hyperglycemia: Secondary | ICD-10-CM | POA: Diagnosis not present

## 2017-08-04 DIAGNOSIS — I10 Essential (primary) hypertension: Secondary | ICD-10-CM | POA: Diagnosis not present

## 2017-09-01 DIAGNOSIS — E1065 Type 1 diabetes mellitus with hyperglycemia: Secondary | ICD-10-CM | POA: Diagnosis not present

## 2017-09-01 DIAGNOSIS — I10 Essential (primary) hypertension: Secondary | ICD-10-CM | POA: Diagnosis not present

## 2017-09-22 DIAGNOSIS — I10 Essential (primary) hypertension: Secondary | ICD-10-CM | POA: Diagnosis not present

## 2017-09-22 DIAGNOSIS — E1065 Type 1 diabetes mellitus with hyperglycemia: Secondary | ICD-10-CM | POA: Diagnosis not present

## 2017-10-01 DIAGNOSIS — E109 Type 1 diabetes mellitus without complications: Secondary | ICD-10-CM | POA: Diagnosis not present

## 2017-11-08 DIAGNOSIS — Z23 Encounter for immunization: Secondary | ICD-10-CM | POA: Diagnosis not present

## 2017-11-08 DIAGNOSIS — E1021 Type 1 diabetes mellitus with diabetic nephropathy: Secondary | ICD-10-CM | POA: Diagnosis not present

## 2017-11-08 DIAGNOSIS — E782 Mixed hyperlipidemia: Secondary | ICD-10-CM | POA: Diagnosis not present

## 2017-11-08 DIAGNOSIS — E1065 Type 1 diabetes mellitus with hyperglycemia: Secondary | ICD-10-CM | POA: Diagnosis not present

## 2017-11-08 DIAGNOSIS — Z0000 Encounter for general adult medical examination without abnormal findings: Secondary | ICD-10-CM | POA: Diagnosis not present

## 2017-11-08 DIAGNOSIS — I10 Essential (primary) hypertension: Secondary | ICD-10-CM | POA: Diagnosis not present

## 2017-11-08 DIAGNOSIS — E039 Hypothyroidism, unspecified: Secondary | ICD-10-CM | POA: Diagnosis not present

## 2017-11-10 DIAGNOSIS — I10 Essential (primary) hypertension: Secondary | ICD-10-CM | POA: Diagnosis not present

## 2017-11-10 DIAGNOSIS — E782 Mixed hyperlipidemia: Secondary | ICD-10-CM | POA: Diagnosis not present

## 2017-11-10 DIAGNOSIS — E1021 Type 1 diabetes mellitus with diabetic nephropathy: Secondary | ICD-10-CM | POA: Diagnosis not present

## 2017-11-20 IMAGING — DX DG CHEST 2V
2 series · 2 of 2 positions shown · non-contrast
Comparison: None.

CLINICAL DATA: Elevated blood pressure.

EXAM:
CHEST  2 VIEW

[chest pa]
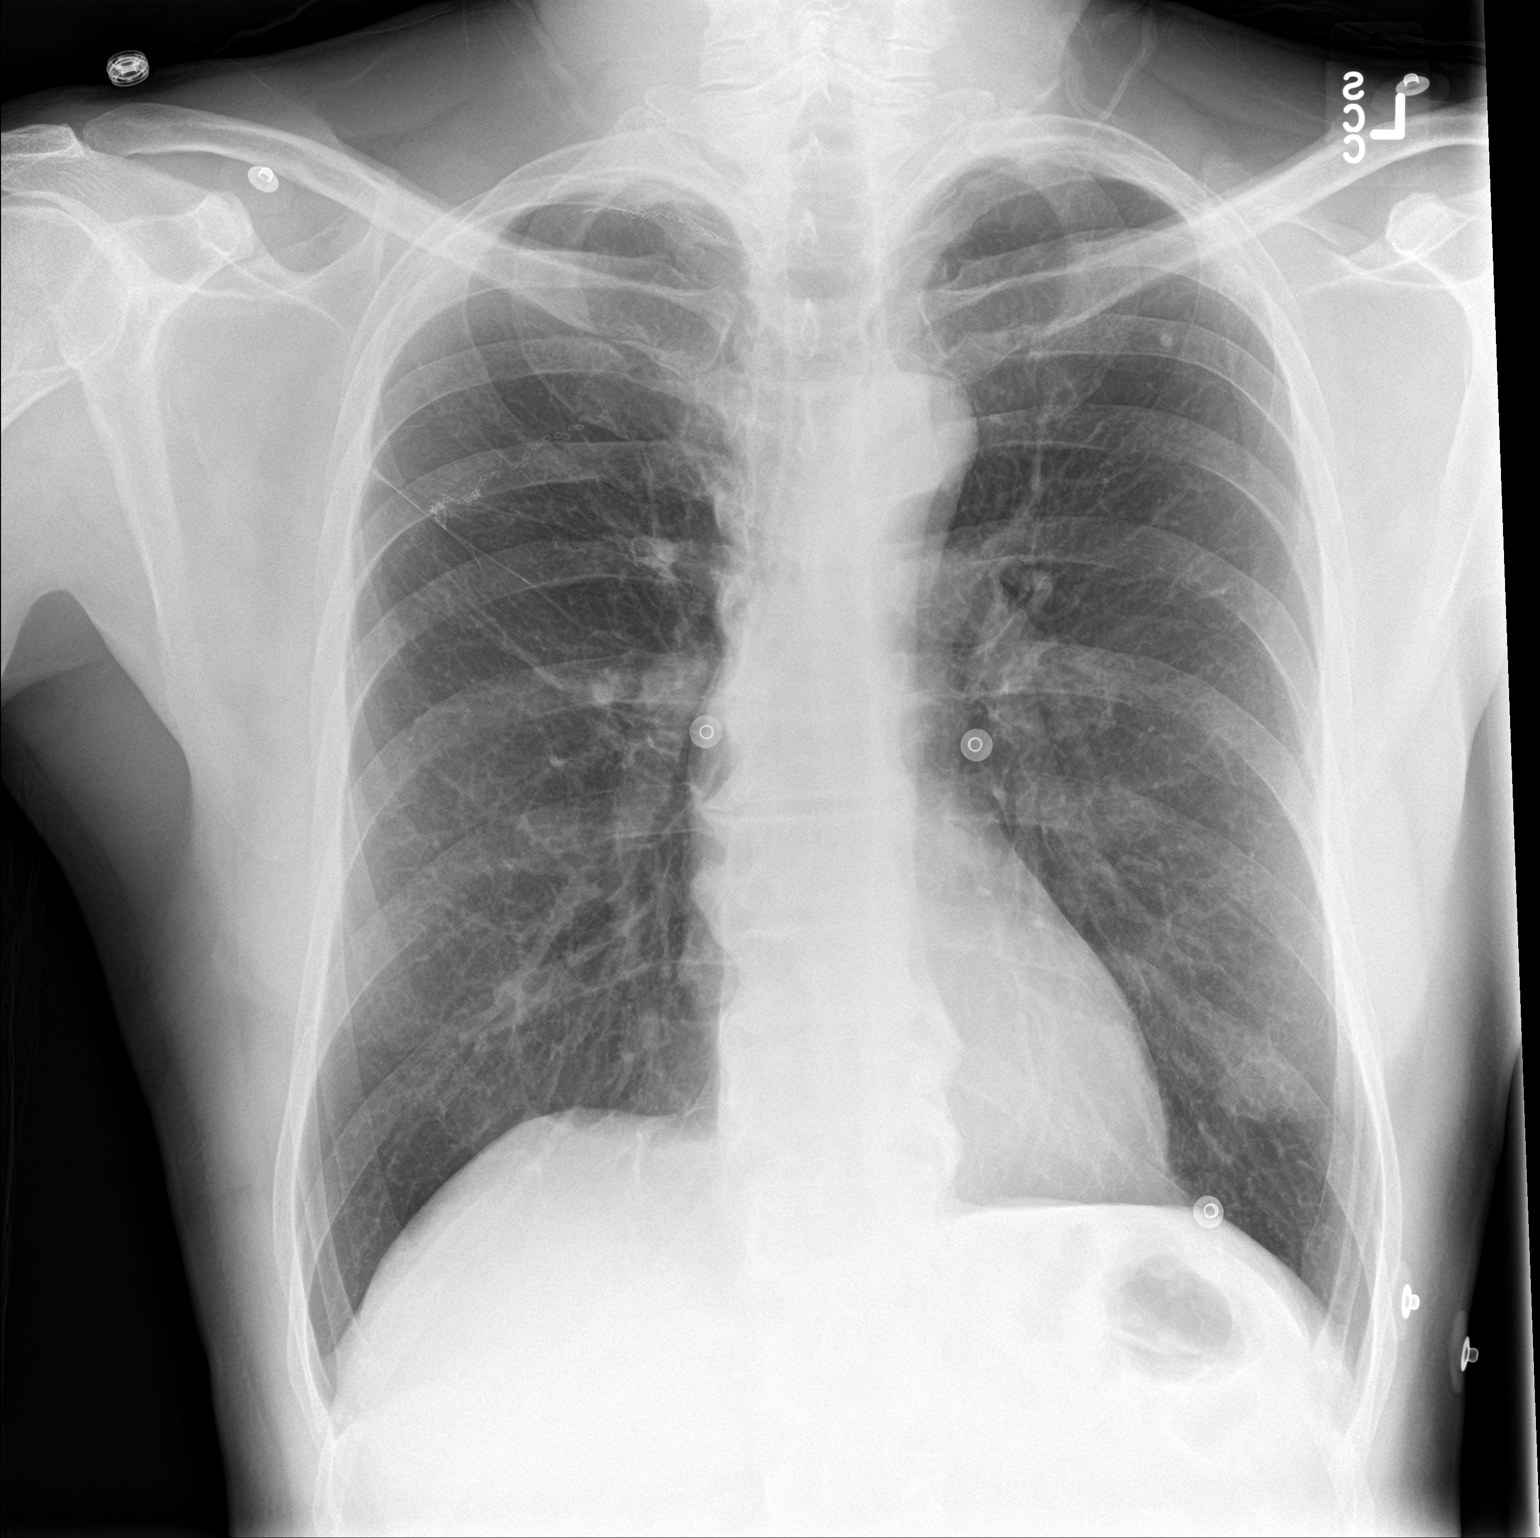

[chest lat]
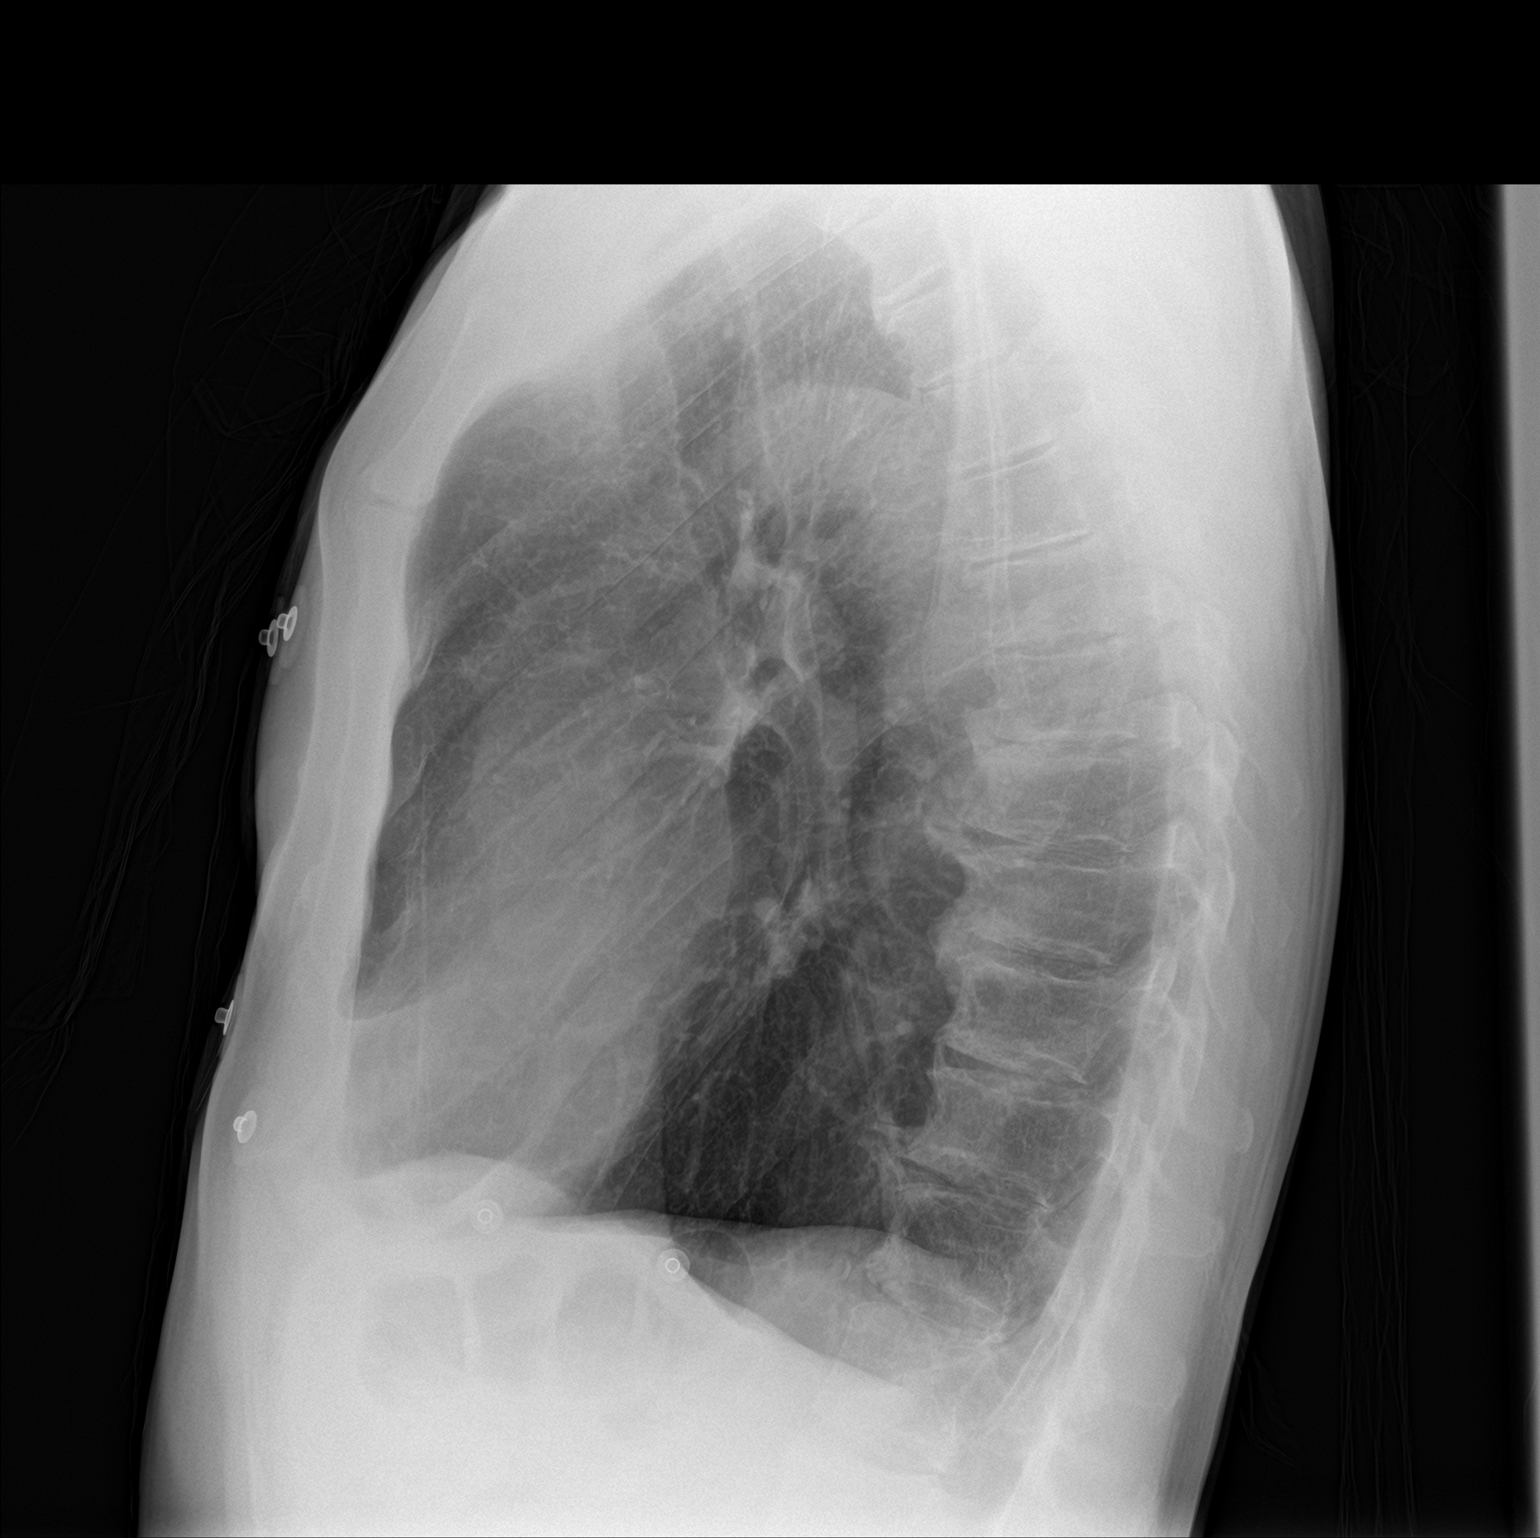

[2 of 2 positions shown; findings below may reference images not displayed]

FINDINGS: The heart size and mediastinal contours are within normal limits.
Postoperative appearance of the right upper lobe noted. Calcified
granulomas noted in the left upper lobe. Spondylosis noted
throughout the thoracic spine.
IMPRESSION: 1. No acute cardiopulmonary abnormalities.

## 2017-11-23 DIAGNOSIS — I10 Essential (primary) hypertension: Secondary | ICD-10-CM | POA: Diagnosis not present

## 2017-11-23 DIAGNOSIS — F329 Major depressive disorder, single episode, unspecified: Secondary | ICD-10-CM | POA: Diagnosis not present

## 2017-11-23 DIAGNOSIS — Z0000 Encounter for general adult medical examination without abnormal findings: Secondary | ICD-10-CM | POA: Diagnosis not present

## 2017-11-23 DIAGNOSIS — E782 Mixed hyperlipidemia: Secondary | ICD-10-CM | POA: Diagnosis not present

## 2017-11-23 DIAGNOSIS — E039 Hypothyroidism, unspecified: Secondary | ICD-10-CM | POA: Diagnosis not present

## 2017-11-23 DIAGNOSIS — E1065 Type 1 diabetes mellitus with hyperglycemia: Secondary | ICD-10-CM | POA: Diagnosis not present

## 2017-11-23 DIAGNOSIS — E1021 Type 1 diabetes mellitus with diabetic nephropathy: Secondary | ICD-10-CM | POA: Diagnosis not present

## 2017-12-21 DIAGNOSIS — F329 Major depressive disorder, single episode, unspecified: Secondary | ICD-10-CM | POA: Diagnosis not present

## 2017-12-21 DIAGNOSIS — E782 Mixed hyperlipidemia: Secondary | ICD-10-CM | POA: Diagnosis not present

## 2017-12-29 DIAGNOSIS — E109 Type 1 diabetes mellitus without complications: Secondary | ICD-10-CM | POA: Diagnosis not present

## 2018-02-23 DIAGNOSIS — E782 Mixed hyperlipidemia: Secondary | ICD-10-CM | POA: Diagnosis not present

## 2018-02-23 DIAGNOSIS — E1021 Type 1 diabetes mellitus with diabetic nephropathy: Secondary | ICD-10-CM | POA: Diagnosis not present

## 2018-02-28 DIAGNOSIS — I10 Essential (primary) hypertension: Secondary | ICD-10-CM | POA: Diagnosis not present

## 2018-02-28 DIAGNOSIS — E782 Mixed hyperlipidemia: Secondary | ICD-10-CM | POA: Diagnosis not present

## 2018-02-28 DIAGNOSIS — E1021 Type 1 diabetes mellitus with diabetic nephropathy: Secondary | ICD-10-CM | POA: Diagnosis not present

## 2018-07-21 DIAGNOSIS — I10 Essential (primary) hypertension: Secondary | ICD-10-CM | POA: Diagnosis not present

## 2018-07-21 DIAGNOSIS — E1065 Type 1 diabetes mellitus with hyperglycemia: Secondary | ICD-10-CM | POA: Diagnosis not present

## 2018-07-21 DIAGNOSIS — E782 Mixed hyperlipidemia: Secondary | ICD-10-CM | POA: Diagnosis not present

## 2018-08-04 DIAGNOSIS — E109 Type 1 diabetes mellitus without complications: Secondary | ICD-10-CM | POA: Diagnosis not present

## 2018-08-05 DIAGNOSIS — E782 Mixed hyperlipidemia: Secondary | ICD-10-CM | POA: Diagnosis not present

## 2018-08-05 DIAGNOSIS — I10 Essential (primary) hypertension: Secondary | ICD-10-CM | POA: Diagnosis not present

## 2018-08-05 DIAGNOSIS — E1065 Type 1 diabetes mellitus with hyperglycemia: Secondary | ICD-10-CM | POA: Diagnosis not present

## 2018-08-19 DIAGNOSIS — E1065 Type 1 diabetes mellitus with hyperglycemia: Secondary | ICD-10-CM | POA: Diagnosis not present

## 2018-08-19 DIAGNOSIS — E782 Mixed hyperlipidemia: Secondary | ICD-10-CM | POA: Diagnosis not present

## 2018-08-19 DIAGNOSIS — I10 Essential (primary) hypertension: Secondary | ICD-10-CM | POA: Diagnosis not present

## 2018-09-08 DIAGNOSIS — E109 Type 1 diabetes mellitus without complications: Secondary | ICD-10-CM | POA: Diagnosis not present

## 2018-09-16 ENCOUNTER — Other Ambulatory Visit: Payer: Self-pay

## 2018-09-16 DIAGNOSIS — E1021 Type 1 diabetes mellitus with diabetic nephropathy: Secondary | ICD-10-CM | POA: Diagnosis not present

## 2018-09-16 DIAGNOSIS — I10 Essential (primary) hypertension: Secondary | ICD-10-CM | POA: Diagnosis not present

## 2018-09-16 DIAGNOSIS — E782 Mixed hyperlipidemia: Secondary | ICD-10-CM | POA: Diagnosis not present

## 2018-09-16 NOTE — Patient Outreach (Signed)
  [facility]) Care Management Chronic Special Needs Program    09/16/2018  Name: Cory [name], DOB: 13-Mar-1951  MRN: [id number]   Mr. Cory [name] is enrolled in a chronic special needs plan. RNCM called to review Health Risk assessment and complete individualized care plan. No answer. HIPAA compliant message left.  Plan: follow up outreach next week.   Thea Silversmith, RN, MSN, [place] [phone]

## 2018-09-19 ENCOUNTER — Other Ambulatory Visit: Payer: Self-pay

## 2018-09-19 NOTE — Patient Outreach (Signed)
  [facility]) Care Management Chronic Special Needs Program    09/19/2018  Name: Cory [name], DOB: July 16, 1950  MRN: [id number]   Mr. Cory [name] is enrolled in a chronic special needs plan. RNCM called to review Health Risk assessment and complete individualized care plan. No answer. HIPAA compliant message left. 2nd outreach attempt.  Plan: RNCM will make telephone outreach within 1-2 weeks.   Thea Silversmith, RN, MSN, [place] [phone]

## 2018-09-20 ENCOUNTER — Other Ambulatory Visit: Payer: Self-pay

## 2018-09-20 ENCOUNTER — Ambulatory Visit: Payer: PPO

## 2018-09-20 NOTE — Patient Outreach (Signed)
  [place] [facility]) Care Management Chronic Special Needs Program  09/20/2018  Name: Cory [name] DOB: 25-May-1950  MRN: [id number]  Mr. Cory [name] is enrolled in a chronic special needs plan for Diabetes. Chronic Care Management Coordinator telephoned client to review health risk assessment and to develop individualized care plan.  Introduced the chronic care management program, importance of client participation, and taking their care plan to all provider appointments and inpatient facilities.  Reviewed the transition of care process and possible referral to community care management.  Subjective: client reports history of diabetes. He reports he has had diabetes since 1980 and is on an insulin pump at this time. Also has [employer]. Client reports last A1C around 8. Client denies any questions or concerns about medications or medication management.  Goals Addressed            This Visit's Progress   . Client understands the importance of follow-up with providers by attending scheduled visits       Diabetes self management actions: Glucose monitoring per provider recommendations Eat Healthy Check feet daily Visit provider every 3-6 months as directed Hbg A1C level every 3-6 months. Eye Exam yearly    . COMPLETED: Client will use Assistive Devices as needed and verbalize understanding of device use       No questions or issues with use of insulin pump.    Marland Kitchen HEMOGLOBIN A1C < 7.0      . Maintain timely refills of diabetic medication as prescribed within the year .      Marland Kitchen Obtain annual  Lipid Profile, LDL-C      . Obtain Annual Eye (retinal)  Exam       . Obtain Annual Foot Exam      . Obtain annual screen for micro albuminuria (urine) , nephropathy (kidney problems)      . Obtain Hemoglobin A1C at least 2 times per year      . Visit Primary Care Provider or Endocrinologist at least 2 times per year         Covid 19 precautions discussed.  RNCM reinforced the 24 hour nurse advice line. RNCM encouraged client to contact health care concierge for benefit questions. Client encouraged to call RNCM as needed/Confirmed he has contact number.  Plan:  Send successful outreach letter with a copy of their individualized care plan, Send individual care plan to provider and Send educational material. Chronic care management coordination will outreach in: 6 months.    Thea Silversmith, RN, MSN, [place] [phone]

## 2018-09-22 ENCOUNTER — Ambulatory Visit: Payer: Self-pay

## 2018-10-10 DIAGNOSIS — E109 Type 1 diabetes mellitus without complications: Secondary | ICD-10-CM | POA: Diagnosis not present

## 2018-11-10 DIAGNOSIS — E109 Type 1 diabetes mellitus without complications: Secondary | ICD-10-CM | POA: Diagnosis not present

## 2018-11-29 DIAGNOSIS — Z7189 Other specified counseling: Secondary | ICD-10-CM | POA: Diagnosis not present

## 2018-11-29 DIAGNOSIS — Z0000 Encounter for general adult medical examination without abnormal findings: Secondary | ICD-10-CM | POA: Diagnosis not present

## 2018-11-29 DIAGNOSIS — E782 Mixed hyperlipidemia: Secondary | ICD-10-CM | POA: Diagnosis not present

## 2018-11-29 DIAGNOSIS — Z23 Encounter for immunization: Secondary | ICD-10-CM | POA: Diagnosis not present

## 2018-11-29 DIAGNOSIS — I10 Essential (primary) hypertension: Secondary | ICD-10-CM | POA: Diagnosis not present

## 2018-11-29 DIAGNOSIS — E1021 Type 1 diabetes mellitus with diabetic nephropathy: Secondary | ICD-10-CM | POA: Diagnosis not present

## 2018-11-29 DIAGNOSIS — E039 Hypothyroidism, unspecified: Secondary | ICD-10-CM | POA: Diagnosis not present

## 2018-12-06 DIAGNOSIS — E039 Hypothyroidism, unspecified: Secondary | ICD-10-CM | POA: Diagnosis not present

## 2018-12-06 DIAGNOSIS — F329 Major depressive disorder, single episode, unspecified: Secondary | ICD-10-CM | POA: Diagnosis not present

## 2018-12-06 DIAGNOSIS — Z7189 Other specified counseling: Secondary | ICD-10-CM | POA: Diagnosis not present

## 2018-12-06 DIAGNOSIS — E1065 Type 1 diabetes mellitus with hyperglycemia: Secondary | ICD-10-CM | POA: Diagnosis not present

## 2018-12-06 DIAGNOSIS — I10 Essential (primary) hypertension: Secondary | ICD-10-CM | POA: Diagnosis not present

## 2018-12-06 DIAGNOSIS — Z0000 Encounter for general adult medical examination without abnormal findings: Secondary | ICD-10-CM | POA: Diagnosis not present

## 2018-12-06 DIAGNOSIS — E782 Mixed hyperlipidemia: Secondary | ICD-10-CM | POA: Diagnosis not present

## 2018-12-06 DIAGNOSIS — E1021 Type 1 diabetes mellitus with diabetic nephropathy: Secondary | ICD-10-CM | POA: Diagnosis not present

## 2018-12-15 DIAGNOSIS — E109 Type 1 diabetes mellitus without complications: Secondary | ICD-10-CM | POA: Diagnosis not present

## 2018-12-16 DIAGNOSIS — E1065 Type 1 diabetes mellitus with hyperglycemia: Secondary | ICD-10-CM | POA: Diagnosis not present

## 2018-12-16 DIAGNOSIS — I10 Essential (primary) hypertension: Secondary | ICD-10-CM | POA: Diagnosis not present

## 2018-12-16 DIAGNOSIS — E039 Hypothyroidism, unspecified: Secondary | ICD-10-CM | POA: Diagnosis not present

## 2018-12-16 DIAGNOSIS — E782 Mixed hyperlipidemia: Secondary | ICD-10-CM | POA: Diagnosis not present

## 2019-01-16 DIAGNOSIS — E109 Type 1 diabetes mellitus without complications: Secondary | ICD-10-CM | POA: Diagnosis not present

## 2019-02-08 DIAGNOSIS — E109 Type 1 diabetes mellitus without complications: Secondary | ICD-10-CM | POA: Diagnosis not present

## 2019-02-13 ENCOUNTER — Encounter (INDEPENDENT_AMBULATORY_CARE_PROVIDER_SITE_OTHER): Payer: Self-pay | Admitting: Otolaryngology

## 2019-02-13 ENCOUNTER — Ambulatory Visit (INDEPENDENT_AMBULATORY_CARE_PROVIDER_SITE_OTHER): Payer: PPO | Admitting: Otolaryngology

## 2019-02-13 ENCOUNTER — Other Ambulatory Visit: Payer: Self-pay

## 2019-02-13 VITALS — Temp 97.7°F

## 2019-02-13 DIAGNOSIS — H60312 Diffuse otitis externa, left ear: Secondary | ICD-10-CM

## 2019-02-13 DIAGNOSIS — H6122 Impacted cerumen, left ear: Secondary | ICD-10-CM | POA: Diagnosis not present

## 2019-02-13 NOTE — Progress Notes (Signed)
HPI: Cory [name] is a 68 y.o. male who presents for evaluation of decreased hearing in his left ear for the past several weeks.  More recently has also noted left ear pain.  His right ear is doing well.  He has had no drainage from his ears. He does not smoke  Past Medical History:  Diagnosis Date  . Hypothyroidism   . Pneumothorax   . Type 1 diabetes mellitus ([place])    Past Surgical History:  Procedure Laterality Date  . CHEST TUBE INSERTION     Social History   Socioeconomic History  . Marital status: Divorced    Spouse name: Not on file  . Number of children: Not on file  . Years of education: Not on file  . Highest education level: Not on file  Occupational History  . Not on file  Social Needs  . Financial resource strain: Not on file  . Food insecurity    Worry: Not on file    Inability: Not on file  . Transportation needs    Medical: Not on file    Non-medical: Not on file  Tobacco Use  . Smoking status: Never Smoker  . Smokeless tobacco: Never Used  Substance and Sexual Activity  . Alcohol use: Yes  . Drug use: No  . Sexual activity: Not on file  Lifestyle  . Physical activity    Days per week: Not on file    Minutes per session: Not on file  . Stress: Not on file  Relationships  . Social Herbalist on phone: Not on file    Gets together: Not on file    Attends religious service: Not on file    Active member of club or organization: Not on file    Attends meetings of clubs or organizations: Not on file    Relationship status: Not on file  Other Topics Concern  . Not on file  Social History Narrative  . Not on file   No family history on file. No Known Allergies Prior to Admission medications   Medication Sig Start Date End Date Taking? Authorizing Provider  aspirin 81 MG tablet Take 81 mg by mouth daily.    [provider]  cephALEXin (KEFLEX) 500 MG capsule Take 1 capsule (500 mg total) by mouth 2 (two) times  daily. Patient not taking: Reported on 09/20/2018 05/05/16   Gardiner Barefoot, DPM  ibuprofen (ADVIL,MOTRIN) 200 MG tablet Take 400 mg by mouth daily.    [provider]  insulin NPH-regular Human (NOVOLIN 70/30) (70-30) 100 UNIT/ML injection Inject 26 Units into the skin 2 (two) times daily.    [provider]  levothyroxine (SYNTHROID, LEVOTHROID) 75 MCG tablet Take 75 mcg by mouth daily before breakfast.    [provider]  rosuvastatin (CRESTOR) 10 MG tablet Take 10 mg by mouth daily.    [provider]     Positive ROS: Otherwise negative  All other systems have been reviewed and were otherwise negative with the exception of those mentioned in the HPI and as above.  Physical Exam: General: Alert, no acute distress Ears: Right ear canal is clear and TM is clear normal.  Left ear canal is completely occluded with cerumen which was removed with forceps and hydroperoxide as well suction.  The TM is clear but he has inflammatory changes of the ear canal.  CSF HC powder was applied. Nasal: Clear nasal passages Oral: Clear oropharynx Neck: No palpable adenopathy or masses  Cerumen impaction removal  Date/Time: 02/13/2019 3:27 PM Performed by: Rozetta Nunnery, MD Authorized by: Rozetta Nunnery, MD   Consent:    Consent obtained:  Verbal   Consent given by:  Patient   Risks discussed:  Pain and bleeding Procedure details:    Location:  L ear   Procedure type: irrigation, suction and forceps   Post-procedure details:    Inspection:  TM intact (He had inflammatory changes of the left ear canal after was cleaned and CSF powder was applied)   Hearing quality:  Improved   Patient tolerance of procedure:  Tolerated well, no immediate complications    Assessment: Left ear cerumen impaction with mild external otitis  Plan: Ear canal was cleaned in the office and hearing was improved. Also prescribed Cortisporin otic suspension drops to use  if he has any ear pain tomorrow  Radene Journey, MD

## 2019-02-14 DIAGNOSIS — E039 Hypothyroidism, unspecified: Secondary | ICD-10-CM | POA: Diagnosis not present

## 2019-02-14 DIAGNOSIS — E782 Mixed hyperlipidemia: Secondary | ICD-10-CM | POA: Diagnosis not present

## 2019-02-16 DIAGNOSIS — E109 Type 1 diabetes mellitus without complications: Secondary | ICD-10-CM | POA: Diagnosis not present

## 2019-02-21 DIAGNOSIS — E039 Hypothyroidism, unspecified: Secondary | ICD-10-CM | POA: Diagnosis not present

## 2019-02-21 DIAGNOSIS — E1021 Type 1 diabetes mellitus with diabetic nephropathy: Secondary | ICD-10-CM | POA: Diagnosis not present

## 2019-02-21 DIAGNOSIS — E782 Mixed hyperlipidemia: Secondary | ICD-10-CM | POA: Diagnosis not present

## 2019-02-21 DIAGNOSIS — I10 Essential (primary) hypertension: Secondary | ICD-10-CM | POA: Diagnosis not present

## 2019-03-16 ENCOUNTER — Other Ambulatory Visit: Payer: Self-pay

## 2019-03-16 NOTE — Patient Outreach (Signed)
  [place] [facility]) Care Management Chronic Special Needs Program  03/16/2019  Name: Elgie Maziarz Chou DOB: Feb 17, 1951  MRN: [id number]  Mr. Jorell Philbrick is enrolled in a Chronic Special Needs Plan. RNCM called to review care plan and update individualized care plan. No answer. HIPPA compliant message left.   Plan: Chronic care management coordinator will attempt outreach within 2-3 weeks.  Thea Silversmith, RN, MSN, [place] [phone]

## 2019-03-17 DIAGNOSIS — E1021 Type 1 diabetes mellitus with diabetic nephropathy: Secondary | ICD-10-CM | POA: Diagnosis not present

## 2019-03-17 DIAGNOSIS — I10 Essential (primary) hypertension: Secondary | ICD-10-CM | POA: Diagnosis not present

## 2019-03-17 DIAGNOSIS — E782 Mixed hyperlipidemia: Secondary | ICD-10-CM | POA: Diagnosis not present

## 2019-03-17 DIAGNOSIS — E039 Hypothyroidism, unspecified: Secondary | ICD-10-CM | POA: Diagnosis not present

## 2019-03-28 DIAGNOSIS — E109 Type 1 diabetes mellitus without complications: Secondary | ICD-10-CM | POA: Diagnosis not present

## 2019-04-04 ENCOUNTER — Other Ambulatory Visit: Payer: Self-pay

## 2019-04-04 NOTE — Patient Outreach (Signed)
  [place] [facility]) Care Management Chronic Special Needs Program    04/04/2019  Name: Machael Raine Mccormac, DOB: 09/25/1950  MRN: [id number]   Mr. Jayshon Whittaker is enrolled in a chronic special needs plan. RNCM called to follow up and review individualized care plan. No answer. HIPAA compliant message left. 2nd outreach attempt.  Plan: Chronic care management coordinator will attempt outreach within 2-3 weeks.  Thea Silversmith, RN, MSN, [place] [phone]

## 2019-04-20 ENCOUNTER — Ambulatory Visit: Payer: Self-pay

## 2019-04-21 ENCOUNTER — Other Ambulatory Visit: Payer: Self-pay

## 2019-04-21 NOTE — Patient Outreach (Signed)
  [facility]) Care Management Chronic Special Needs Program    04/21/2019  Name: Cory [name], DOB: 09-Jan-1951  MRN: [id number]   Mr. Cory [name] is enrolled in a chronic special needs plan for Diabetes. RNCM called to assist with completion of health risk assessment and update individualized care plan. No answer. HIPAA compliant message left.   Plan: Chronic care management coordinator will attempt outreach within 2-3 weeks.  Kathyrn Sheriff, RN, MSN, [facility] Chronic Care Management Coordinator [facility] [phone]

## 2019-04-28 ENCOUNTER — Other Ambulatory Visit: Payer: Self-pay

## 2019-04-28 DIAGNOSIS — E109 Type 1 diabetes mellitus without complications: Secondary | ICD-10-CM | POA: Diagnosis not present

## 2019-04-28 NOTE — Patient Outreach (Addendum)
[facility]) Care Management Chronic Special Needs Program  04/28/2019  Name: Cory [name] DOB: 1950-12-03  MRN: [id number]  Mr. Cory [name] is enrolled in a chronic special needs plan for Diabetes.  Chronic Care Management Coordinator telephoned client to complete health risk assessment and to develop individualized care plan.  Reviewed the chronic care management program, importance of client participation, and taking their care plan to all provider appointments and inpatient facilities.    RNCM returned call to client who was returning RNCM's call and requested information about how to obtain the Covid-19 vaccine.  Health risk assessment completed.  Subjective: Client reports he is doing well. He reports he has type 1 diabetes and is on insulin pump and uses Freestyle libre glucose meter. Client states he attends provider visits as scheduled. Last A1C 6.8 on 03/17/2019. Client reports blood sugar this morning was 180 and he gave himself insulin based on blood sugar of 180. Client denies any questions, but is interested in finding out how to sign up for the Covid-19 vaccination.  Advanced directive discussed. Client reports he does not have an advanced directive or health care power of attorney and declines any information.   PHQ2 score 2 and PHQ 9 score 6. Client states he is not able to get out like he use to due to Covid 19. Client declines social work referral. He states he will discuss with his provider as needed.   Goals Addressed            This Visit's Progress   . COMPLETED: Client understands the importance of follow-up with providers by attending scheduled visits       Voiced understanding the importance of following up with providers.  Diabetes self management actions: Glucose monitoring per provider recommendations Eat Healthy Check feet daily Visit provider every 3-6 months as directed Hbg A1C level every 3-6 months. Eye Exam yearly    . HEMOGLOBIN A1C < 7       A1C less than 6.8 on 03/17/2019    . Maintain timely refills of diabetic medication as prescribed within the year .   On track    Denies any difficulty obtaining medications    . Obtain annual  Lipid Profile, LDL-C   On track    Done 11/29/2018    . Obtain Annual Eye (retinal)  Exam    Not on track    Last eye exam 06/14/2017    . Obtain Annual Foot Exam   On track    Done 12/06/2018    . Obtain annual screen for micro albuminuria (urine) , nephropathy (kidney problems)   On track    Per client done- done 11/29/2018    . Obtain Hemoglobin A1C at least 2 times per year   On track    Client reports has been checked twice this year. Last 6.8 on 03/17/2019    . Visit Primary Care Provider or Endocrinologist at least 2 times per year    On track    Done 07/21/2018; 08/19/2018; 11/29/2018; 02/21/2019      RNCM discussed usual signs/symptoms of low blood sugar and treatment for low blood sugar. Client expressed he is familiar with the usual symptoms.   Covid-19 precautions discussed. RNCM encouraged client to call 24 hour nurse advice line as needed. RNCM encouraged client to call the healthcare concierge for benefits questions. RNCM encouraged client to call RNCM as needed,   Per client request, RNCM provided client with the [place] web link to  sign up for covid-19 vaccine [email or web address].   Plan: RNCM will send updated care plan to primary care. RNCM will send updated care plan to client. Send educational material.     Thea Silversmith, RN, MSN, [place] [phone]  [facility] called primary care office to inform of PHQ 2 and PHQ9 score. Office is closed RNCM will follow up next business day.

## 2019-05-01 ENCOUNTER — Ambulatory Visit: Payer: Self-pay

## 2019-05-30 DIAGNOSIS — E109 Type 1 diabetes mellitus without complications: Secondary | ICD-10-CM | POA: Diagnosis not present

## 2019-06-13 DIAGNOSIS — E039 Hypothyroidism, unspecified: Secondary | ICD-10-CM | POA: Diagnosis not present

## 2019-06-13 DIAGNOSIS — E782 Mixed hyperlipidemia: Secondary | ICD-10-CM | POA: Diagnosis not present

## 2019-06-13 DIAGNOSIS — E1021 Type 1 diabetes mellitus with diabetic nephropathy: Secondary | ICD-10-CM | POA: Diagnosis not present

## 2019-06-13 DIAGNOSIS — I10 Essential (primary) hypertension: Secondary | ICD-10-CM | POA: Diagnosis not present

## 2019-06-16 DIAGNOSIS — E782 Mixed hyperlipidemia: Secondary | ICD-10-CM | POA: Diagnosis not present

## 2019-06-16 DIAGNOSIS — E1021 Type 1 diabetes mellitus with diabetic nephropathy: Secondary | ICD-10-CM | POA: Diagnosis not present

## 2019-06-16 DIAGNOSIS — I10 Essential (primary) hypertension: Secondary | ICD-10-CM | POA: Diagnosis not present

## 2019-06-16 DIAGNOSIS — E039 Hypothyroidism, unspecified: Secondary | ICD-10-CM | POA: Diagnosis not present

## 2019-06-20 DIAGNOSIS — E782 Mixed hyperlipidemia: Secondary | ICD-10-CM | POA: Diagnosis not present

## 2019-06-20 DIAGNOSIS — E039 Hypothyroidism, unspecified: Secondary | ICD-10-CM | POA: Diagnosis not present

## 2019-06-20 DIAGNOSIS — I10 Essential (primary) hypertension: Secondary | ICD-10-CM | POA: Diagnosis not present

## 2019-07-05 DIAGNOSIS — E1065 Type 1 diabetes mellitus with hyperglycemia: Secondary | ICD-10-CM | POA: Diagnosis not present

## 2019-08-07 DIAGNOSIS — E1065 Type 1 diabetes mellitus with hyperglycemia: Secondary | ICD-10-CM | POA: Diagnosis not present

## 2019-09-07 DIAGNOSIS — E1065 Type 1 diabetes mellitus with hyperglycemia: Secondary | ICD-10-CM | POA: Diagnosis not present

## 2019-09-14 DIAGNOSIS — E782 Mixed hyperlipidemia: Secondary | ICD-10-CM | POA: Diagnosis not present

## 2019-09-14 DIAGNOSIS — E039 Hypothyroidism, unspecified: Secondary | ICD-10-CM | POA: Diagnosis not present

## 2019-09-14 DIAGNOSIS — I10 Essential (primary) hypertension: Secondary | ICD-10-CM | POA: Diagnosis not present

## 2019-09-14 DIAGNOSIS — E1021 Type 1 diabetes mellitus with diabetic nephropathy: Secondary | ICD-10-CM | POA: Diagnosis not present

## 2019-10-10 DIAGNOSIS — E1021 Type 1 diabetes mellitus with diabetic nephropathy: Secondary | ICD-10-CM | POA: Diagnosis not present

## 2019-10-10 DIAGNOSIS — E782 Mixed hyperlipidemia: Secondary | ICD-10-CM | POA: Diagnosis not present

## 2019-10-10 DIAGNOSIS — E039 Hypothyroidism, unspecified: Secondary | ICD-10-CM | POA: Diagnosis not present

## 2019-10-10 DIAGNOSIS — I10 Essential (primary) hypertension: Secondary | ICD-10-CM | POA: Diagnosis not present

## 2019-10-16 DIAGNOSIS — E1065 Type 1 diabetes mellitus with hyperglycemia: Secondary | ICD-10-CM | POA: Diagnosis not present

## 2019-10-17 DIAGNOSIS — I10 Essential (primary) hypertension: Secondary | ICD-10-CM | POA: Diagnosis not present

## 2019-10-17 DIAGNOSIS — E1065 Type 1 diabetes mellitus with hyperglycemia: Secondary | ICD-10-CM | POA: Diagnosis not present

## 2019-10-17 DIAGNOSIS — E039 Hypothyroidism, unspecified: Secondary | ICD-10-CM | POA: Diagnosis not present

## 2019-10-17 DIAGNOSIS — E782 Mixed hyperlipidemia: Secondary | ICD-10-CM | POA: Diagnosis not present

## 2019-10-17 DIAGNOSIS — E1021 Type 1 diabetes mellitus with diabetic nephropathy: Secondary | ICD-10-CM | POA: Diagnosis not present

## 2019-11-16 DIAGNOSIS — E1065 Type 1 diabetes mellitus with hyperglycemia: Secondary | ICD-10-CM | POA: Diagnosis not present

## 2019-12-08 DIAGNOSIS — E1065 Type 1 diabetes mellitus with hyperglycemia: Secondary | ICD-10-CM | POA: Diagnosis not present

## 2019-12-18 DIAGNOSIS — E1065 Type 1 diabetes mellitus with hyperglycemia: Secondary | ICD-10-CM | POA: Diagnosis not present

## 2020-01-09 ENCOUNTER — Other Ambulatory Visit: Payer: Self-pay

## 2020-01-09 NOTE — Patient Outreach (Signed)
  [facility]) Care Management Chronic Special Needs Program    01/09/2020  Name: Cory [name], DOB: 01-11-1951  MRN: [id number]   [name] is enrolled in a chronic special needs plan for Diabetes. RNCM called to follow up and update care plan. No answer. HIPAA compliant message left.  Plan: continue to follow. RNCM will outreach within the next 1-2 weeks.  Kathyrn Sheriff, RN, MSN, [facility] Chronic Care Management Coordinator [facility] [phone]

## 2020-01-16 ENCOUNTER — Other Ambulatory Visit: Payer: Self-pay

## 2020-01-16 NOTE — Patient Outreach (Signed)
  [facility]) Care Management Chronic Special Needs Program    01/16/2020  Name: Lunden Mcleish Uno, DOB: 1950-10-12  MRN: [id number]   Mr. Rickardo Quiroa is enrolled in a chronic special needs plan for Diabetes. RNCM called to follow up, review and updated individualized care plan. No answer. HIPAA compliant message left. 2nd outreach attempt.  Plan: Chronic care management coordinator will attempt outreach within 1-2 weeks.  Kathyrn Sheriff, RN, MSN, [facility] Chronic Care Management Coordinator [facility] [phone]

## 2020-01-18 DIAGNOSIS — E039 Hypothyroidism, unspecified: Secondary | ICD-10-CM | POA: Diagnosis not present

## 2020-01-18 DIAGNOSIS — E1021 Type 1 diabetes mellitus with diabetic nephropathy: Secondary | ICD-10-CM | POA: Diagnosis not present

## 2020-01-18 DIAGNOSIS — E782 Mixed hyperlipidemia: Secondary | ICD-10-CM | POA: Diagnosis not present

## 2020-01-18 DIAGNOSIS — I10 Essential (primary) hypertension: Secondary | ICD-10-CM | POA: Diagnosis not present

## 2020-01-18 DIAGNOSIS — Z23 Encounter for immunization: Secondary | ICD-10-CM | POA: Diagnosis not present

## 2020-01-22 DIAGNOSIS — E1065 Type 1 diabetes mellitus with hyperglycemia: Secondary | ICD-10-CM | POA: Diagnosis not present

## 2020-01-24 ENCOUNTER — Other Ambulatory Visit: Payer: Self-pay

## 2020-01-24 NOTE — Patient Outreach (Signed)
  [facility]) Care Management Chronic Special Needs Program  01/24/2020  Name: Cory [name] DOB: 1950/04/18  MRN: [id number]  Mr. Cory [name] is enrolled in a chronic special needs plan for Diabetes. RNCM called to follow up, review and update care plan. No answer. HIPAA compliant message left. 3rd outreach attempt. Care plan updated based upon available data.  Goals Addressed            This Visit's Progress   . HEMOGLOBIN A1C < 7       A1C less than 6.9 on 10/11/2019. Great Job!  Continue Diabetes self management actions:  Glucose monitoring per provider recommendations  Eat Healthy  Visit provider every 3-6 months as directed  Hbg A1C level every 3-6 months.  Take medications as prescribed.  Attend provider visits as recommended.    . COMPLETED: Maintain timely refills of diabetic medication as prescribed within the year .       Per dispense report timely refills of diabetic medication complete.    . COMPLETED: Obtain annual  Lipid Profile, LDL-C       Done 10/10/2019    . Obtain Annual Eye (retinal)  Exam    On track    Unable to discuss with client. Diabetes can affect your vision. Plan to have a dilated eye exam every year.     . Obtain Annual Foot Exam   On track    Diabetes can affect the nerves in your feet, causing decreased feeling or numbness. Please follow up with provider for recommended annual exam. [employer] Education: "Foot care for diabetics" provided. Please review and call if you have any questions.    . COMPLETED: Obtain annual screen for micro albuminuria (urine) , nephropathy (kidney problems)       Per records completed on 10/10/19.    . Obtain Hemoglobin A1C at least 2 times per year   On track    Unable to discuss. Last documented A1C 6.9 on 10/11/19.     . Visit Primary Care Provider or Endocrinologist at least 2 times per year    On track    Unknown. Unable to discuss with client.   Complete your annual wellness  visit every year. It is important to follow up with your provider as scheduled for recommended labs, procedures and prescription refills.      Plan: RNCM will send updated care plan to client; send updated care plan to primary care provider. Client will be outreached by [facility] RNCM within 9-12 months per Tier level. [facility] Case Management Team will follow member moving forward with assessments, care plans and documentation.  Kathyrn Sheriff, RN, MSN, [facility] Chronic Care Management Coordinator [facility] [phone]

## 2020-02-07 DIAGNOSIS — I10 Essential (primary) hypertension: Secondary | ICD-10-CM | POA: Diagnosis not present

## 2020-02-07 DIAGNOSIS — E782 Mixed hyperlipidemia: Secondary | ICD-10-CM | POA: Diagnosis not present

## 2020-02-07 DIAGNOSIS — E1065 Type 1 diabetes mellitus with hyperglycemia: Secondary | ICD-10-CM | POA: Diagnosis not present

## 2020-02-07 DIAGNOSIS — Z0000 Encounter for general adult medical examination without abnormal findings: Secondary | ICD-10-CM | POA: Diagnosis not present

## 2020-02-07 DIAGNOSIS — E1021 Type 1 diabetes mellitus with diabetic nephropathy: Secondary | ICD-10-CM | POA: Diagnosis not present

## 2020-02-07 DIAGNOSIS — E039 Hypothyroidism, unspecified: Secondary | ICD-10-CM | POA: Diagnosis not present

## 2020-02-14 ENCOUNTER — Other Ambulatory Visit: Payer: Self-pay

## 2020-02-14 NOTE — Patient Outreach (Signed)
  [facility]) Care Management Chronic Special Needs Program    02/14/2020  Name: Cory [name], DOB: 1951/03/23  MRN: [id number]   Mr. Cory [name] is enrolled in a chronic special needs plan for Diabetes. [facility] Care Management will continue to provide services for this member through 04/05/2020. The [facility] Care Management Team will assume care 04/06/2020.   Kathyrn Sheriff, RN, MSN, [facility] Chronic Care Management Coordinator [facility] [phone]

## 2020-02-15 DIAGNOSIS — E1021 Type 1 diabetes mellitus with diabetic nephropathy: Secondary | ICD-10-CM | POA: Diagnosis not present

## 2020-02-15 DIAGNOSIS — E782 Mixed hyperlipidemia: Secondary | ICD-10-CM | POA: Diagnosis not present

## 2020-02-15 DIAGNOSIS — I10 Essential (primary) hypertension: Secondary | ICD-10-CM | POA: Diagnosis not present

## 2020-02-15 DIAGNOSIS — E039 Hypothyroidism, unspecified: Secondary | ICD-10-CM | POA: Diagnosis not present

## 2020-02-19 DIAGNOSIS — E039 Hypothyroidism, unspecified: Secondary | ICD-10-CM | POA: Diagnosis not present

## 2020-02-19 DIAGNOSIS — E1065 Type 1 diabetes mellitus with hyperglycemia: Secondary | ICD-10-CM | POA: Diagnosis not present

## 2020-02-19 DIAGNOSIS — E1021 Type 1 diabetes mellitus with diabetic nephropathy: Secondary | ICD-10-CM | POA: Diagnosis not present

## 2020-02-19 DIAGNOSIS — E1069 Type 1 diabetes mellitus with other specified complication: Secondary | ICD-10-CM | POA: Diagnosis not present

## 2020-02-19 DIAGNOSIS — N182 Chronic kidney disease, stage 2 (mild): Secondary | ICD-10-CM | POA: Diagnosis not present

## 2020-02-19 DIAGNOSIS — E782 Mixed hyperlipidemia: Secondary | ICD-10-CM | POA: Diagnosis not present

## 2020-02-19 DIAGNOSIS — I10 Essential (primary) hypertension: Secondary | ICD-10-CM | POA: Diagnosis not present

## 2020-02-19 DIAGNOSIS — Z0000 Encounter for general adult medical examination without abnormal findings: Secondary | ICD-10-CM | POA: Diagnosis not present

## 2020-02-22 DIAGNOSIS — E1065 Type 1 diabetes mellitus with hyperglycemia: Secondary | ICD-10-CM | POA: Diagnosis not present

## 2020-03-25 DIAGNOSIS — E1065 Type 1 diabetes mellitus with hyperglycemia: Secondary | ICD-10-CM | POA: Diagnosis not present

## 2020-04-11 ENCOUNTER — Other Ambulatory Visit: Payer: Self-pay

## 2020-04-25 DIAGNOSIS — E1065 Type 1 diabetes mellitus with hyperglycemia: Secondary | ICD-10-CM | POA: Diagnosis not present

## 2020-05-31 DIAGNOSIS — E782 Mixed hyperlipidemia: Secondary | ICD-10-CM | POA: Diagnosis not present

## 2020-05-31 DIAGNOSIS — E1021 Type 1 diabetes mellitus with diabetic nephropathy: Secondary | ICD-10-CM | POA: Diagnosis not present

## 2020-05-31 DIAGNOSIS — E039 Hypothyroidism, unspecified: Secondary | ICD-10-CM | POA: Diagnosis not present

## 2020-05-31 DIAGNOSIS — I10 Essential (primary) hypertension: Secondary | ICD-10-CM | POA: Diagnosis not present

## 2020-05-31 DIAGNOSIS — Z794 Long term (current) use of insulin: Secondary | ICD-10-CM | POA: Diagnosis not present

## 2020-05-31 DIAGNOSIS — E1065 Type 1 diabetes mellitus with hyperglycemia: Secondary | ICD-10-CM | POA: Diagnosis not present

## 2020-06-17 DIAGNOSIS — I10 Essential (primary) hypertension: Secondary | ICD-10-CM | POA: Diagnosis not present

## 2020-06-17 DIAGNOSIS — E039 Hypothyroidism, unspecified: Secondary | ICD-10-CM | POA: Diagnosis not present

## 2020-06-17 DIAGNOSIS — E1065 Type 1 diabetes mellitus with hyperglycemia: Secondary | ICD-10-CM | POA: Diagnosis not present

## 2020-06-17 DIAGNOSIS — N182 Chronic kidney disease, stage 2 (mild): Secondary | ICD-10-CM | POA: Diagnosis not present

## 2020-06-17 DIAGNOSIS — E1021 Type 1 diabetes mellitus with diabetic nephropathy: Secondary | ICD-10-CM | POA: Diagnosis not present

## 2020-06-17 DIAGNOSIS — E782 Mixed hyperlipidemia: Secondary | ICD-10-CM | POA: Diagnosis not present

## 2020-06-24 DIAGNOSIS — E782 Mixed hyperlipidemia: Secondary | ICD-10-CM | POA: Diagnosis not present

## 2020-06-24 DIAGNOSIS — I10 Essential (primary) hypertension: Secondary | ICD-10-CM | POA: Diagnosis not present

## 2020-06-24 DIAGNOSIS — E1069 Type 1 diabetes mellitus with other specified complication: Secondary | ICD-10-CM | POA: Diagnosis not present

## 2020-06-24 DIAGNOSIS — N182 Chronic kidney disease, stage 2 (mild): Secondary | ICD-10-CM | POA: Diagnosis not present

## 2020-06-24 DIAGNOSIS — E039 Hypothyroidism, unspecified: Secondary | ICD-10-CM | POA: Diagnosis not present

## 2020-07-11 DIAGNOSIS — E1065 Type 1 diabetes mellitus with hyperglycemia: Secondary | ICD-10-CM | POA: Diagnosis not present

## 2020-09-16 DIAGNOSIS — E1065 Type 1 diabetes mellitus with hyperglycemia: Secondary | ICD-10-CM | POA: Diagnosis not present

## 2020-09-24 DIAGNOSIS — E039 Hypothyroidism, unspecified: Secondary | ICD-10-CM | POA: Diagnosis not present

## 2020-09-24 DIAGNOSIS — E1021 Type 1 diabetes mellitus with diabetic nephropathy: Secondary | ICD-10-CM | POA: Diagnosis not present

## 2020-09-24 DIAGNOSIS — E782 Mixed hyperlipidemia: Secondary | ICD-10-CM | POA: Diagnosis not present

## 2020-09-24 DIAGNOSIS — Z794 Long term (current) use of insulin: Secondary | ICD-10-CM | POA: Diagnosis not present

## 2020-09-24 DIAGNOSIS — I10 Essential (primary) hypertension: Secondary | ICD-10-CM | POA: Diagnosis not present

## 2020-11-05 DIAGNOSIS — Z794 Long term (current) use of insulin: Secondary | ICD-10-CM | POA: Diagnosis not present

## 2020-11-05 DIAGNOSIS — I10 Essential (primary) hypertension: Secondary | ICD-10-CM | POA: Diagnosis not present

## 2020-11-05 DIAGNOSIS — E039 Hypothyroidism, unspecified: Secondary | ICD-10-CM | POA: Diagnosis not present

## 2020-11-05 DIAGNOSIS — E1021 Type 1 diabetes mellitus with diabetic nephropathy: Secondary | ICD-10-CM | POA: Diagnosis not present

## 2020-11-05 DIAGNOSIS — E782 Mixed hyperlipidemia: Secondary | ICD-10-CM | POA: Diagnosis not present

## 2020-11-11 DIAGNOSIS — E1065 Type 1 diabetes mellitus with hyperglycemia: Secondary | ICD-10-CM | POA: Diagnosis not present

## 2020-11-11 DIAGNOSIS — H1131 Conjunctival hemorrhage, right eye: Secondary | ICD-10-CM | POA: Diagnosis not present

## 2020-11-11 DIAGNOSIS — M79674 Pain in right toe(s): Secondary | ICD-10-CM | POA: Diagnosis not present

## 2020-11-11 DIAGNOSIS — N182 Chronic kidney disease, stage 2 (mild): Secondary | ICD-10-CM | POA: Diagnosis not present

## 2020-11-11 DIAGNOSIS — Z794 Long term (current) use of insulin: Secondary | ICD-10-CM | POA: Diagnosis not present

## 2020-11-11 DIAGNOSIS — E782 Mixed hyperlipidemia: Secondary | ICD-10-CM | POA: Diagnosis not present

## 2020-11-14 DIAGNOSIS — E1065 Type 1 diabetes mellitus with hyperglycemia: Secondary | ICD-10-CM | POA: Diagnosis not present

## 2020-11-19 DIAGNOSIS — M79675 Pain in left toe(s): Secondary | ICD-10-CM | POA: Diagnosis not present

## 2020-11-19 DIAGNOSIS — M7752 Other enthesopathy of left foot: Secondary | ICD-10-CM | POA: Diagnosis not present

## 2020-11-19 DIAGNOSIS — L600 Ingrowing nail: Secondary | ICD-10-CM | POA: Diagnosis not present

## 2020-11-19 DIAGNOSIS — B351 Tinea unguium: Secondary | ICD-10-CM | POA: Diagnosis not present

## 2020-12-03 DIAGNOSIS — L600 Ingrowing nail: Secondary | ICD-10-CM | POA: Diagnosis not present

## 2020-12-03 DIAGNOSIS — B351 Tinea unguium: Secondary | ICD-10-CM | POA: Diagnosis not present

## 2020-12-03 DIAGNOSIS — M79675 Pain in left toe(s): Secondary | ICD-10-CM | POA: Diagnosis not present

## 2020-12-03 DIAGNOSIS — M7752 Other enthesopathy of left foot: Secondary | ICD-10-CM | POA: Diagnosis not present

## 2021-02-06 DIAGNOSIS — Z794 Long term (current) use of insulin: Secondary | ICD-10-CM | POA: Diagnosis not present

## 2021-02-06 DIAGNOSIS — E039 Hypothyroidism, unspecified: Secondary | ICD-10-CM | POA: Diagnosis not present

## 2021-02-06 DIAGNOSIS — E1021 Type 1 diabetes mellitus with diabetic nephropathy: Secondary | ICD-10-CM | POA: Diagnosis not present

## 2021-02-06 DIAGNOSIS — I10 Essential (primary) hypertension: Secondary | ICD-10-CM | POA: Diagnosis not present

## 2021-02-06 DIAGNOSIS — E782 Mixed hyperlipidemia: Secondary | ICD-10-CM | POA: Diagnosis not present

## 2021-03-03 DIAGNOSIS — Z0000 Encounter for general adult medical examination without abnormal findings: Secondary | ICD-10-CM | POA: Diagnosis not present

## 2021-03-10 DIAGNOSIS — E039 Hypothyroidism, unspecified: Secondary | ICD-10-CM | POA: Diagnosis not present

## 2021-03-10 DIAGNOSIS — Z0000 Encounter for general adult medical examination without abnormal findings: Secondary | ICD-10-CM | POA: Diagnosis not present

## 2021-03-10 DIAGNOSIS — N182 Chronic kidney disease, stage 2 (mild): Secondary | ICD-10-CM | POA: Diagnosis not present

## 2021-03-10 DIAGNOSIS — E1021 Type 1 diabetes mellitus with diabetic nephropathy: Secondary | ICD-10-CM | POA: Diagnosis not present

## 2021-03-10 DIAGNOSIS — I10 Essential (primary) hypertension: Secondary | ICD-10-CM | POA: Diagnosis not present

## 2021-03-10 DIAGNOSIS — Z794 Long term (current) use of insulin: Secondary | ICD-10-CM | POA: Diagnosis not present

## 2021-03-10 DIAGNOSIS — E782 Mixed hyperlipidemia: Secondary | ICD-10-CM | POA: Diagnosis not present

## 2021-03-18 DIAGNOSIS — I10 Essential (primary) hypertension: Secondary | ICD-10-CM | POA: Diagnosis not present

## 2021-03-18 DIAGNOSIS — H52223 Regular astigmatism, bilateral: Secondary | ICD-10-CM | POA: Diagnosis not present

## 2021-03-18 DIAGNOSIS — H524 Presbyopia: Secondary | ICD-10-CM | POA: Diagnosis not present

## 2021-03-18 DIAGNOSIS — H5203 Hypermetropia, bilateral: Secondary | ICD-10-CM | POA: Diagnosis not present

## 2021-03-18 DIAGNOSIS — E119 Type 2 diabetes mellitus without complications: Secondary | ICD-10-CM | POA: Diagnosis not present

## 2021-03-18 DIAGNOSIS — H35033 Hypertensive retinopathy, bilateral: Secondary | ICD-10-CM | POA: Diagnosis not present

## 2021-03-18 DIAGNOSIS — H35371 Puckering of macula, right eye: Secondary | ICD-10-CM | POA: Diagnosis not present

## 2021-05-06 DIAGNOSIS — E1065 Type 1 diabetes mellitus with hyperglycemia: Secondary | ICD-10-CM | POA: Diagnosis not present

## 2021-05-08 DIAGNOSIS — E039 Hypothyroidism, unspecified: Secondary | ICD-10-CM | POA: Diagnosis not present

## 2021-05-08 DIAGNOSIS — Z794 Long term (current) use of insulin: Secondary | ICD-10-CM | POA: Diagnosis not present

## 2021-05-08 DIAGNOSIS — E782 Mixed hyperlipidemia: Secondary | ICD-10-CM | POA: Diagnosis not present

## 2021-05-08 DIAGNOSIS — E1021 Type 1 diabetes mellitus with diabetic nephropathy: Secondary | ICD-10-CM | POA: Diagnosis not present

## 2021-08-13 DIAGNOSIS — E1021 Type 1 diabetes mellitus with diabetic nephropathy: Secondary | ICD-10-CM | POA: Diagnosis not present

## 2021-08-13 DIAGNOSIS — Z794 Long term (current) use of insulin: Secondary | ICD-10-CM | POA: Diagnosis not present

## 2021-08-13 DIAGNOSIS — E782 Mixed hyperlipidemia: Secondary | ICD-10-CM | POA: Diagnosis not present

## 2021-08-13 DIAGNOSIS — I10 Essential (primary) hypertension: Secondary | ICD-10-CM | POA: Diagnosis not present

## 2021-08-13 DIAGNOSIS — E039 Hypothyroidism, unspecified: Secondary | ICD-10-CM | POA: Diagnosis not present

## 2021-08-13 DIAGNOSIS — N182 Chronic kidney disease, stage 2 (mild): Secondary | ICD-10-CM | POA: Diagnosis not present

## 2021-08-20 DIAGNOSIS — E782 Mixed hyperlipidemia: Secondary | ICD-10-CM | POA: Diagnosis not present

## 2021-08-20 DIAGNOSIS — N182 Chronic kidney disease, stage 2 (mild): Secondary | ICD-10-CM | POA: Diagnosis not present

## 2021-08-20 DIAGNOSIS — E1021 Type 1 diabetes mellitus with diabetic nephropathy: Secondary | ICD-10-CM | POA: Diagnosis not present

## 2021-08-20 DIAGNOSIS — Z794 Long term (current) use of insulin: Secondary | ICD-10-CM | POA: Diagnosis not present

## 2021-08-20 DIAGNOSIS — E039 Hypothyroidism, unspecified: Secondary | ICD-10-CM | POA: Diagnosis not present

## 2021-09-09 DIAGNOSIS — E782 Mixed hyperlipidemia: Secondary | ICD-10-CM | POA: Diagnosis not present

## 2021-09-09 DIAGNOSIS — Z794 Long term (current) use of insulin: Secondary | ICD-10-CM | POA: Diagnosis not present

## 2021-09-09 DIAGNOSIS — E039 Hypothyroidism, unspecified: Secondary | ICD-10-CM | POA: Diagnosis not present

## 2021-09-09 DIAGNOSIS — E1021 Type 1 diabetes mellitus with diabetic nephropathy: Secondary | ICD-10-CM | POA: Diagnosis not present

## 2021-12-11 DIAGNOSIS — Z794 Long term (current) use of insulin: Secondary | ICD-10-CM | POA: Diagnosis not present

## 2021-12-11 DIAGNOSIS — E1065 Type 1 diabetes mellitus with hyperglycemia: Secondary | ICD-10-CM | POA: Diagnosis not present

## 2022-05-12 DIAGNOSIS — E1065 Type 1 diabetes mellitus with hyperglycemia: Secondary | ICD-10-CM | POA: Diagnosis not present

## 2022-06-11 DIAGNOSIS — E1065 Type 1 diabetes mellitus with hyperglycemia: Secondary | ICD-10-CM | POA: Diagnosis not present

## 2022-06-11 DIAGNOSIS — Z794 Long term (current) use of insulin: Secondary | ICD-10-CM | POA: Diagnosis not present

## 2022-07-21 DIAGNOSIS — Z794 Long term (current) use of insulin: Secondary | ICD-10-CM | POA: Diagnosis not present

## 2022-07-21 DIAGNOSIS — I10 Essential (primary) hypertension: Secondary | ICD-10-CM | POA: Diagnosis not present

## 2022-07-21 DIAGNOSIS — E1021 Type 1 diabetes mellitus with diabetic nephropathy: Secondary | ICD-10-CM | POA: Diagnosis not present

## 2022-07-21 DIAGNOSIS — E782 Mixed hyperlipidemia: Secondary | ICD-10-CM | POA: Diagnosis not present

## 2022-08-07 DIAGNOSIS — E1065 Type 1 diabetes mellitus with hyperglycemia: Secondary | ICD-10-CM | POA: Diagnosis not present

## 2022-09-07 DIAGNOSIS — E1065 Type 1 diabetes mellitus with hyperglycemia: Secondary | ICD-10-CM | POA: Diagnosis not present

## 2022-09-09 DIAGNOSIS — E039 Hypothyroidism, unspecified: Secondary | ICD-10-CM | POA: Diagnosis not present

## 2022-09-09 DIAGNOSIS — Z0000 Encounter for general adult medical examination without abnormal findings: Secondary | ICD-10-CM | POA: Diagnosis not present

## 2022-09-09 DIAGNOSIS — E782 Mixed hyperlipidemia: Secondary | ICD-10-CM | POA: Diagnosis not present

## 2022-09-09 DIAGNOSIS — E1021 Type 1 diabetes mellitus with diabetic nephropathy: Secondary | ICD-10-CM | POA: Diagnosis not present

## 2022-09-09 DIAGNOSIS — Z794 Long term (current) use of insulin: Secondary | ICD-10-CM | POA: Diagnosis not present

## 2022-09-09 DIAGNOSIS — N182 Chronic kidney disease, stage 2 (mild): Secondary | ICD-10-CM | POA: Diagnosis not present

## 2022-09-09 DIAGNOSIS — I10 Essential (primary) hypertension: Secondary | ICD-10-CM | POA: Diagnosis not present

## 2022-09-16 DIAGNOSIS — E039 Hypothyroidism, unspecified: Secondary | ICD-10-CM | POA: Diagnosis not present

## 2022-09-16 DIAGNOSIS — E1021 Type 1 diabetes mellitus with diabetic nephropathy: Secondary | ICD-10-CM | POA: Diagnosis not present

## 2022-09-16 DIAGNOSIS — Z794 Long term (current) use of insulin: Secondary | ICD-10-CM | POA: Diagnosis not present

## 2022-09-16 DIAGNOSIS — N182 Chronic kidney disease, stage 2 (mild): Secondary | ICD-10-CM | POA: Diagnosis not present

## 2022-09-16 DIAGNOSIS — E782 Mixed hyperlipidemia: Secondary | ICD-10-CM | POA: Diagnosis not present

## 2022-09-16 DIAGNOSIS — I10 Essential (primary) hypertension: Secondary | ICD-10-CM | POA: Diagnosis not present

## 2022-09-16 DIAGNOSIS — Z23 Encounter for immunization: Secondary | ICD-10-CM | POA: Diagnosis not present

## 2022-10-07 DIAGNOSIS — E1065 Type 1 diabetes mellitus with hyperglycemia: Secondary | ICD-10-CM | POA: Diagnosis not present

## 2022-10-16 DIAGNOSIS — H2513 Age-related nuclear cataract, bilateral: Secondary | ICD-10-CM | POA: Diagnosis not present

## 2022-10-16 DIAGNOSIS — H5203 Hypermetropia, bilateral: Secondary | ICD-10-CM | POA: Diagnosis not present

## 2022-10-16 DIAGNOSIS — E103293 Type 1 diabetes mellitus with mild nonproliferative diabetic retinopathy without macular edema, bilateral: Secondary | ICD-10-CM | POA: Diagnosis not present

## 2022-10-16 DIAGNOSIS — H35371 Puckering of macula, right eye: Secondary | ICD-10-CM | POA: Diagnosis not present

## 2022-10-20 DIAGNOSIS — E1021 Type 1 diabetes mellitus with diabetic nephropathy: Secondary | ICD-10-CM | POA: Diagnosis not present

## 2022-10-20 DIAGNOSIS — E782 Mixed hyperlipidemia: Secondary | ICD-10-CM | POA: Diagnosis not present

## 2022-10-20 DIAGNOSIS — E103293 Type 1 diabetes mellitus with mild nonproliferative diabetic retinopathy without macular edema, bilateral: Secondary | ICD-10-CM | POA: Diagnosis not present

## 2022-10-20 DIAGNOSIS — I10 Essential (primary) hypertension: Secondary | ICD-10-CM | POA: Diagnosis not present

## 2022-10-20 DIAGNOSIS — Z794 Long term (current) use of insulin: Secondary | ICD-10-CM | POA: Diagnosis not present

## 2022-10-26 DIAGNOSIS — Z961 Presence of intraocular lens: Secondary | ICD-10-CM | POA: Diagnosis not present

## 2022-10-26 DIAGNOSIS — H25811 Combined forms of age-related cataract, right eye: Secondary | ICD-10-CM | POA: Diagnosis not present

## 2022-10-26 DIAGNOSIS — H2511 Age-related nuclear cataract, right eye: Secondary | ICD-10-CM | POA: Diagnosis not present

## 2022-11-07 DIAGNOSIS — E1065 Type 1 diabetes mellitus with hyperglycemia: Secondary | ICD-10-CM | POA: Diagnosis not present

## 2022-11-17 DIAGNOSIS — E1065 Type 1 diabetes mellitus with hyperglycemia: Secondary | ICD-10-CM | POA: Diagnosis not present

## 2022-11-24 DIAGNOSIS — Z961 Presence of intraocular lens: Secondary | ICD-10-CM | POA: Diagnosis not present

## 2022-11-24 DIAGNOSIS — H35371 Puckering of macula, right eye: Secondary | ICD-10-CM | POA: Diagnosis not present

## 2022-12-08 DIAGNOSIS — E1065 Type 1 diabetes mellitus with hyperglycemia: Secondary | ICD-10-CM | POA: Diagnosis not present

## 2022-12-21 DIAGNOSIS — H25812 Combined forms of age-related cataract, left eye: Secondary | ICD-10-CM | POA: Diagnosis not present

## 2022-12-21 DIAGNOSIS — H2512 Age-related nuclear cataract, left eye: Secondary | ICD-10-CM | POA: Diagnosis not present

## 2022-12-21 DIAGNOSIS — Z961 Presence of intraocular lens: Secondary | ICD-10-CM | POA: Diagnosis not present

## 2023-01-07 DIAGNOSIS — E1065 Type 1 diabetes mellitus with hyperglycemia: Secondary | ICD-10-CM | POA: Diagnosis not present

## 2023-01-21 DIAGNOSIS — E1021 Type 1 diabetes mellitus with diabetic nephropathy: Secondary | ICD-10-CM | POA: Diagnosis not present

## 2023-01-21 DIAGNOSIS — I10 Essential (primary) hypertension: Secondary | ICD-10-CM | POA: Diagnosis not present

## 2023-01-21 DIAGNOSIS — Z794 Long term (current) use of insulin: Secondary | ICD-10-CM | POA: Diagnosis not present

## 2023-01-21 DIAGNOSIS — E1065 Type 1 diabetes mellitus with hyperglycemia: Secondary | ICD-10-CM | POA: Diagnosis not present

## 2023-01-21 DIAGNOSIS — E103293 Type 1 diabetes mellitus with mild nonproliferative diabetic retinopathy without macular edema, bilateral: Secondary | ICD-10-CM | POA: Diagnosis not present

## 2023-01-21 DIAGNOSIS — E782 Mixed hyperlipidemia: Secondary | ICD-10-CM | POA: Diagnosis not present

## 2023-02-07 DIAGNOSIS — E1065 Type 1 diabetes mellitus with hyperglycemia: Secondary | ICD-10-CM | POA: Diagnosis not present

## 2023-03-09 DIAGNOSIS — E1065 Type 1 diabetes mellitus with hyperglycemia: Secondary | ICD-10-CM | POA: Diagnosis not present

## 2023-03-17 DIAGNOSIS — Z0000 Encounter for general adult medical examination without abnormal findings: Secondary | ICD-10-CM | POA: Diagnosis not present

## 2023-03-17 DIAGNOSIS — Z23 Encounter for immunization: Secondary | ICD-10-CM | POA: Diagnosis not present

## 2023-03-17 DIAGNOSIS — E782 Mixed hyperlipidemia: Secondary | ICD-10-CM | POA: Diagnosis not present

## 2023-03-17 DIAGNOSIS — Z794 Long term (current) use of insulin: Secondary | ICD-10-CM | POA: Diagnosis not present

## 2023-03-17 DIAGNOSIS — E039 Hypothyroidism, unspecified: Secondary | ICD-10-CM | POA: Diagnosis not present

## 2023-03-17 DIAGNOSIS — I10 Essential (primary) hypertension: Secondary | ICD-10-CM | POA: Diagnosis not present

## 2023-03-17 DIAGNOSIS — E1021 Type 1 diabetes mellitus with diabetic nephropathy: Secondary | ICD-10-CM | POA: Diagnosis not present

## 2023-03-17 DIAGNOSIS — N182 Chronic kidney disease, stage 2 (mild): Secondary | ICD-10-CM | POA: Diagnosis not present

## 2023-03-24 DIAGNOSIS — I10 Essential (primary) hypertension: Secondary | ICD-10-CM | POA: Diagnosis not present

## 2023-03-24 DIAGNOSIS — E782 Mixed hyperlipidemia: Secondary | ICD-10-CM | POA: Diagnosis not present

## 2023-03-24 DIAGNOSIS — E1021 Type 1 diabetes mellitus with diabetic nephropathy: Secondary | ICD-10-CM | POA: Diagnosis not present

## 2023-03-24 DIAGNOSIS — Z794 Long term (current) use of insulin: Secondary | ICD-10-CM | POA: Diagnosis not present

## 2023-03-24 DIAGNOSIS — N182 Chronic kidney disease, stage 2 (mild): Secondary | ICD-10-CM | POA: Diagnosis not present

## 2023-03-24 DIAGNOSIS — E039 Hypothyroidism, unspecified: Secondary | ICD-10-CM | POA: Diagnosis not present

## 2023-03-24 DIAGNOSIS — Z0000 Encounter for general adult medical examination without abnormal findings: Secondary | ICD-10-CM | POA: Diagnosis not present

## 2023-04-09 DIAGNOSIS — E1065 Type 1 diabetes mellitus with hyperglycemia: Secondary | ICD-10-CM | POA: Diagnosis not present

## 2023-04-22 DIAGNOSIS — E782 Mixed hyperlipidemia: Secondary | ICD-10-CM | POA: Diagnosis not present

## 2023-04-22 DIAGNOSIS — E103293 Type 1 diabetes mellitus with mild nonproliferative diabetic retinopathy without macular edema, bilateral: Secondary | ICD-10-CM | POA: Diagnosis not present

## 2023-04-22 DIAGNOSIS — E1021 Type 1 diabetes mellitus with diabetic nephropathy: Secondary | ICD-10-CM | POA: Diagnosis not present

## 2023-04-22 DIAGNOSIS — Z794 Long term (current) use of insulin: Secondary | ICD-10-CM | POA: Diagnosis not present

## 2023-04-22 DIAGNOSIS — I10 Essential (primary) hypertension: Secondary | ICD-10-CM | POA: Diagnosis not present

## 2023-04-22 DIAGNOSIS — E039 Hypothyroidism, unspecified: Secondary | ICD-10-CM | POA: Diagnosis not present

## 2023-04-22 DIAGNOSIS — B351 Tinea unguium: Secondary | ICD-10-CM | POA: Diagnosis not present

## 2023-05-10 DIAGNOSIS — E1065 Type 1 diabetes mellitus with hyperglycemia: Secondary | ICD-10-CM | POA: Diagnosis not present

## 2023-05-17 DIAGNOSIS — B353 Tinea pedis: Secondary | ICD-10-CM | POA: Diagnosis not present

## 2023-05-17 DIAGNOSIS — B351 Tinea unguium: Secondary | ICD-10-CM | POA: Diagnosis not present

## 2023-05-17 DIAGNOSIS — E1065 Type 1 diabetes mellitus with hyperglycemia: Secondary | ICD-10-CM | POA: Diagnosis not present

## 2023-05-29 DIAGNOSIS — B351 Tinea unguium: Secondary | ICD-10-CM | POA: Diagnosis not present

## 2023-06-07 DIAGNOSIS — E1065 Type 1 diabetes mellitus with hyperglycemia: Secondary | ICD-10-CM | POA: Diagnosis not present

## 2023-06-07 DIAGNOSIS — B351 Tinea unguium: Secondary | ICD-10-CM | POA: Diagnosis not present

## 2023-07-08 DIAGNOSIS — E1065 Type 1 diabetes mellitus with hyperglycemia: Secondary | ICD-10-CM | POA: Diagnosis not present

## 2023-07-20 DIAGNOSIS — E782 Mixed hyperlipidemia: Secondary | ICD-10-CM | POA: Diagnosis not present

## 2023-07-20 DIAGNOSIS — Z794 Long term (current) use of insulin: Secondary | ICD-10-CM | POA: Diagnosis not present

## 2023-07-20 DIAGNOSIS — E1065 Type 1 diabetes mellitus with hyperglycemia: Secondary | ICD-10-CM | POA: Diagnosis not present

## 2023-08-07 DIAGNOSIS — E1065 Type 1 diabetes mellitus with hyperglycemia: Secondary | ICD-10-CM | POA: Diagnosis not present

## 2023-09-13 DIAGNOSIS — B351 Tinea unguium: Secondary | ICD-10-CM | POA: Diagnosis not present

## 2023-09-22 DIAGNOSIS — E1021 Type 1 diabetes mellitus with diabetic nephropathy: Secondary | ICD-10-CM | POA: Diagnosis not present

## 2023-09-22 DIAGNOSIS — E039 Hypothyroidism, unspecified: Secondary | ICD-10-CM | POA: Diagnosis not present

## 2023-09-22 DIAGNOSIS — Z794 Long term (current) use of insulin: Secondary | ICD-10-CM | POA: Diagnosis not present

## 2023-09-22 DIAGNOSIS — N182 Chronic kidney disease, stage 2 (mild): Secondary | ICD-10-CM | POA: Diagnosis not present

## 2023-09-22 DIAGNOSIS — I10 Essential (primary) hypertension: Secondary | ICD-10-CM | POA: Diagnosis not present

## 2023-09-22 DIAGNOSIS — E782 Mixed hyperlipidemia: Secondary | ICD-10-CM | POA: Diagnosis not present

## 2023-09-30 DIAGNOSIS — E782 Mixed hyperlipidemia: Secondary | ICD-10-CM | POA: Diagnosis not present

## 2023-09-30 DIAGNOSIS — Z794 Long term (current) use of insulin: Secondary | ICD-10-CM | POA: Diagnosis not present

## 2023-09-30 DIAGNOSIS — N182 Chronic kidney disease, stage 2 (mild): Secondary | ICD-10-CM | POA: Diagnosis not present

## 2023-09-30 DIAGNOSIS — I10 Essential (primary) hypertension: Secondary | ICD-10-CM | POA: Diagnosis not present

## 2023-09-30 DIAGNOSIS — E039 Hypothyroidism, unspecified: Secondary | ICD-10-CM | POA: Diagnosis not present

## 2023-09-30 DIAGNOSIS — E1021 Type 1 diabetes mellitus with diabetic nephropathy: Secondary | ICD-10-CM | POA: Diagnosis not present

## 2023-11-10 DIAGNOSIS — E1065 Type 1 diabetes mellitus with hyperglycemia: Secondary | ICD-10-CM | POA: Diagnosis not present

## 2023-11-18 DIAGNOSIS — E1021 Type 1 diabetes mellitus with diabetic nephropathy: Secondary | ICD-10-CM | POA: Diagnosis not present

## 2023-11-18 DIAGNOSIS — E039 Hypothyroidism, unspecified: Secondary | ICD-10-CM | POA: Diagnosis not present

## 2023-11-25 DIAGNOSIS — I10 Essential (primary) hypertension: Secondary | ICD-10-CM | POA: Diagnosis not present

## 2023-11-25 DIAGNOSIS — E1021 Type 1 diabetes mellitus with diabetic nephropathy: Secondary | ICD-10-CM | POA: Diagnosis not present

## 2023-11-25 DIAGNOSIS — Z794 Long term (current) use of insulin: Secondary | ICD-10-CM | POA: Diagnosis not present

## 2023-11-25 DIAGNOSIS — E782 Mixed hyperlipidemia: Secondary | ICD-10-CM | POA: Diagnosis not present

## 2023-11-25 DIAGNOSIS — N182 Chronic kidney disease, stage 2 (mild): Secondary | ICD-10-CM | POA: Diagnosis not present

## 2023-11-25 DIAGNOSIS — E039 Hypothyroidism, unspecified: Secondary | ICD-10-CM | POA: Diagnosis not present

## 2023-12-30 DIAGNOSIS — I10 Essential (primary) hypertension: Secondary | ICD-10-CM | POA: Diagnosis not present

## 2023-12-30 DIAGNOSIS — Z794 Long term (current) use of insulin: Secondary | ICD-10-CM | POA: Diagnosis not present

## 2023-12-30 DIAGNOSIS — E039 Hypothyroidism, unspecified: Secondary | ICD-10-CM | POA: Diagnosis not present

## 2023-12-30 DIAGNOSIS — E1021 Type 1 diabetes mellitus with diabetic nephropathy: Secondary | ICD-10-CM | POA: Diagnosis not present

## 2023-12-30 DIAGNOSIS — E782 Mixed hyperlipidemia: Secondary | ICD-10-CM | POA: Diagnosis not present

## 2024-01-28 DIAGNOSIS — H35371 Puckering of macula, right eye: Secondary | ICD-10-CM | POA: Diagnosis not present

## 2024-01-28 DIAGNOSIS — Z961 Presence of intraocular lens: Secondary | ICD-10-CM | POA: Diagnosis not present

## 2024-01-28 DIAGNOSIS — H26493 Other secondary cataract, bilateral: Secondary | ICD-10-CM | POA: Diagnosis not present

## 2024-01-28 DIAGNOSIS — H52203 Unspecified astigmatism, bilateral: Secondary | ICD-10-CM | POA: Diagnosis not present

## 2024-01-28 DIAGNOSIS — E103293 Type 1 diabetes mellitus with mild nonproliferative diabetic retinopathy without macular edema, bilateral: Secondary | ICD-10-CM | POA: Diagnosis not present

## 2024-02-17 DIAGNOSIS — Z794 Long term (current) use of insulin: Secondary | ICD-10-CM | POA: Diagnosis not present

## 2024-02-17 DIAGNOSIS — E782 Mixed hyperlipidemia: Secondary | ICD-10-CM | POA: Diagnosis not present

## 2024-02-17 DIAGNOSIS — N182 Chronic kidney disease, stage 2 (mild): Secondary | ICD-10-CM | POA: Diagnosis not present

## 2024-02-17 DIAGNOSIS — I10 Essential (primary) hypertension: Secondary | ICD-10-CM | POA: Diagnosis not present

## 2024-02-17 DIAGNOSIS — E039 Hypothyroidism, unspecified: Secondary | ICD-10-CM | POA: Diagnosis not present

## 2024-02-17 DIAGNOSIS — E1021 Type 1 diabetes mellitus with diabetic nephropathy: Secondary | ICD-10-CM | POA: Diagnosis not present

## 2024-02-24 DIAGNOSIS — E039 Hypothyroidism, unspecified: Secondary | ICD-10-CM | POA: Diagnosis not present

## 2024-02-24 DIAGNOSIS — Z23 Encounter for immunization: Secondary | ICD-10-CM | POA: Diagnosis not present

## 2024-02-24 DIAGNOSIS — E782 Mixed hyperlipidemia: Secondary | ICD-10-CM | POA: Diagnosis not present

## 2024-02-24 DIAGNOSIS — N182 Chronic kidney disease, stage 2 (mild): Secondary | ICD-10-CM | POA: Diagnosis not present

## 2024-02-24 DIAGNOSIS — Z794 Long term (current) use of insulin: Secondary | ICD-10-CM | POA: Diagnosis not present

## 2024-02-24 DIAGNOSIS — I10 Essential (primary) hypertension: Secondary | ICD-10-CM | POA: Diagnosis not present

## 2024-02-24 DIAGNOSIS — E1021 Type 1 diabetes mellitus with diabetic nephropathy: Secondary | ICD-10-CM | POA: Diagnosis not present

## 2024-03-15 DIAGNOSIS — Z83719 Family history of colon polyps, unspecified: Secondary | ICD-10-CM | POA: Diagnosis not present

## 2024-03-15 DIAGNOSIS — Z6822 Body mass index (BMI) 22.0-22.9, adult: Secondary | ICD-10-CM | POA: Diagnosis not present

## 2024-03-15 DIAGNOSIS — Z860100 Personal history of colon polyps, unspecified: Secondary | ICD-10-CM | POA: Diagnosis not present

## 2024-03-15 DIAGNOSIS — Z79899 Other long term (current) drug therapy: Secondary | ICD-10-CM | POA: Diagnosis not present

## 2024-03-15 DIAGNOSIS — Z9181 History of falling: Secondary | ICD-10-CM | POA: Diagnosis not present
